# Patient Record
Sex: Female | Born: 1970 | Race: White | Hispanic: No | Marital: Married | Smoking: Former smoker
Health system: Southern US, Community
[De-identification: ages and names within clinical notes are randomized; demographics above are authoritative.]

## PROBLEM LIST (undated history)

## (undated) DIAGNOSIS — B373 Candidiasis of vulva and vagina: Secondary | ICD-10-CM

## (undated) DIAGNOSIS — IMO0002 Reserved for concepts with insufficient information to code with codable children: Secondary | ICD-10-CM

## (undated) DIAGNOSIS — N200 Calculus of kidney: Secondary | ICD-10-CM

## (undated) DIAGNOSIS — Z973 Presence of spectacles and contact lenses: Secondary | ICD-10-CM

## (undated) DIAGNOSIS — Z87440 Personal history of urinary (tract) infections: Secondary | ICD-10-CM

## (undated) DIAGNOSIS — R7309 Other abnormal glucose: Secondary | ICD-10-CM

## (undated) DIAGNOSIS — I639 Cerebral infarction, unspecified: Secondary | ICD-10-CM

## (undated) DIAGNOSIS — F329 Major depressive disorder, single episode, unspecified: Secondary | ICD-10-CM

## (undated) DIAGNOSIS — K759 Inflammatory liver disease, unspecified: Secondary | ICD-10-CM

## (undated) DIAGNOSIS — Q211 Atrial septal defect: Secondary | ICD-10-CM

## (undated) DIAGNOSIS — Z87898 Personal history of other specified conditions: Secondary | ICD-10-CM

## (undated) DIAGNOSIS — Z01419 Encounter for gynecological examination (general) (routine) without abnormal findings: Secondary | ICD-10-CM

## (undated) DIAGNOSIS — Q2112 Patent foramen ovale: Secondary | ICD-10-CM

## (undated) DIAGNOSIS — E039 Hypothyroidism, unspecified: Secondary | ICD-10-CM

## (undated) DIAGNOSIS — M25559 Pain in unspecified hip: Secondary | ICD-10-CM

## (undated) DIAGNOSIS — F32A Depression, unspecified: Secondary | ICD-10-CM

## (undated) DIAGNOSIS — T7840XA Allergy, unspecified, initial encounter: Secondary | ICD-10-CM

## (undated) DIAGNOSIS — O09529 Supervision of elderly multigravida, unspecified trimester: Secondary | ICD-10-CM

## (undated) DIAGNOSIS — D219 Benign neoplasm of connective and other soft tissue, unspecified: Secondary | ICD-10-CM

## (undated) DIAGNOSIS — Z8619 Personal history of other infectious and parasitic diseases: Secondary | ICD-10-CM

## (undated) HISTORY — DX: Encounter for gynecological examination (general) (routine) without abnormal findings: Z01.419

## (undated) HISTORY — DX: Inflammatory liver disease, unspecified: K75.9

## (undated) HISTORY — DX: Reserved for concepts with insufficient information to code with codable children: IMO0002

## (undated) HISTORY — DX: Candidiasis of vulva and vagina: B37.3

## (undated) HISTORY — DX: Allergy, unspecified, initial encounter: T78.40XA

## (undated) HISTORY — DX: Hypothyroidism, unspecified: E03.9

## (undated) HISTORY — DX: Other abnormal glucose: R73.09

## (undated) HISTORY — DX: Personal history of urinary (tract) infections: Z87.440

## (undated) HISTORY — PX: UMBILICAL HERNIA REPAIR: SHX196

## (undated) HISTORY — DX: Presence of spectacles and contact lenses: Z97.3

## (undated) HISTORY — DX: Depression, unspecified: F32.A

## (undated) HISTORY — DX: Supervision of elderly multigravida, unspecified trimester: O09.529

## (undated) HISTORY — DX: Personal history of other specified conditions: Z87.898

## (undated) HISTORY — DX: Personal history of other infectious and parasitic diseases: Z86.19

## (undated) HISTORY — DX: Cerebral infarction, unspecified: I63.9

## (undated) HISTORY — DX: Major depressive disorder, single episode, unspecified: F32.9

## (undated) HISTORY — DX: Benign neoplasm of connective and other soft tissue, unspecified: D21.9

---

## 2004-12-20 DIAGNOSIS — Z87898 Personal history of other specified conditions: Secondary | ICD-10-CM

## 2004-12-20 DIAGNOSIS — B3731 Acute candidiasis of vulva and vagina: Secondary | ICD-10-CM

## 2004-12-20 DIAGNOSIS — B373 Candidiasis of vulva and vagina: Secondary | ICD-10-CM

## 2004-12-20 HISTORY — DX: Acute candidiasis of vulva and vagina: B37.31

## 2004-12-20 HISTORY — DX: Personal history of other specified conditions: Z87.898

## 2004-12-20 HISTORY — DX: Candidiasis of vulva and vagina: B37.3

## 2005-09-30 ENCOUNTER — Other Ambulatory Visit: Admission: RE | Admit: 2005-09-30 | Discharge: 2005-09-30 | Payer: Self-pay | Admitting: Obstetrics and Gynecology

## 2006-04-05 ENCOUNTER — Inpatient Hospital Stay (HOSPITAL_COMMUNITY): Admission: AD | Admit: 2006-04-05 | Discharge: 2006-04-07 | Payer: Self-pay | Admitting: Obstetrics and Gynecology

## 2006-04-08 ENCOUNTER — Ambulatory Visit: Admission: RE | Admit: 2006-04-08 | Discharge: 2006-04-08 | Payer: Self-pay | Admitting: Obstetrics and Gynecology

## 2009-02-27 ENCOUNTER — Inpatient Hospital Stay (HOSPITAL_COMMUNITY): Admission: AD | Admit: 2009-02-27 | Discharge: 2009-03-01 | Payer: Self-pay | Admitting: Obstetrics and Gynecology

## 2011-04-01 LAB — CBC
MCHC: 33.3 g/dL (ref 30.0–36.0)
Platelets: 177 10*3/uL (ref 150–400)
Platelets: 208 10*3/uL (ref 150–400)
RBC: 3.44 MIL/uL — ABNORMAL LOW (ref 3.87–5.11)
RDW: 13.6 % (ref 11.5–15.5)
WBC: 7.8 10*3/uL (ref 4.0–10.5)

## 2011-04-01 LAB — RPR: RPR Ser Ql: NONREACTIVE
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
## 2011-06-02 ENCOUNTER — Encounter: Payer: Self-pay | Admitting: *Deleted

## 2011-06-07 ENCOUNTER — Encounter: Payer: Self-pay | Admitting: Family Medicine

## 2011-06-07 ENCOUNTER — Ambulatory Visit (INDEPENDENT_AMBULATORY_CARE_PROVIDER_SITE_OTHER): Payer: BC Managed Care – PPO | Admitting: Family Medicine

## 2011-06-07 VITALS — BP 108/74 | HR 68 | Ht 63.0 in | Wt 140.0 lb

## 2011-06-07 DIAGNOSIS — Z23 Encounter for immunization: Secondary | ICD-10-CM

## 2011-06-07 DIAGNOSIS — J309 Allergic rhinitis, unspecified: Secondary | ICD-10-CM

## 2011-06-07 DIAGNOSIS — R5383 Other fatigue: Secondary | ICD-10-CM

## 2011-06-07 DIAGNOSIS — R5381 Other malaise: Secondary | ICD-10-CM

## 2011-06-07 DIAGNOSIS — R413 Other amnesia: Secondary | ICD-10-CM

## 2011-06-07 DIAGNOSIS — Z0000 Encounter for general adult medical examination without abnormal findings: Secondary | ICD-10-CM

## 2011-06-07 LAB — POCT URINALYSIS DIPSTICK
Bilirubin, UA: NEGATIVE
Glucose, UA: NEGATIVE
Leukocytes, UA: NEGATIVE
Nitrite, UA: NEGATIVE
pH, UA: 7

## 2011-06-07 NOTE — Progress Notes (Signed)
Subjective:    Patient ID: Julie Blair, female    DOB: Dec 08, 1971, 40 y.o.   MRN: [id number]  HPI Lachrisha Ziebarth is a 40 y.o. female who presents for a complete physical.  She sees Dr. Normand Sloop for her GYN care. She has the following concerns: Feels like she is thinking slower than she used to, having some intermittent memory problems.  Sleeping 6 hours/night.   There is no immunization history on file for this patient. Last tetanus: over 10 years ago Gets flu shots yearly Last Pap smear: last summer (appt next month) Last mammogram: never Last colonoscopy: N/A Last DEXA: N/A Exercise:  2-3 times/week Dentist: once a year Ophtho: yearly  Past Medical History  Diagnosis Date  . Elevated glucose tolerance test gestional 11/2008    3 hour test was normal  . Allergy seasonal    Past Surgical History  Procedure Date  . Umbilical hernia repair age 34 or 8    History   Social History  . Marital Status: Married    Spouse Name: N/A    Number of Children: 2  . Years of Education: N/A   Occupational History  . works in Engineering geologist at [employer]   Social History Main Topics  . Smoking status: Former Smoker    Quit date: 12/20/1994  . Smokeless tobacco: Not on file  . Alcohol Use: Yes     1-2 glasses of wine or beer EOD  . Drug Use: No  . Sexually Active: Yes    Birth Control/ Protection: Condom   Other Topics Concern  . Not on file   Social History Narrative  . No narrative on file    Family History  Problem Relation Age of Onset  . Adopted: Yes  . Breast cancer Maternal Aunt   . Mental illness Mother   . Heart disease Maternal Grandmother   . Heart disease Maternal Grandfather     Current outpatient prescriptions:B Complex-C-E-Zn (B COMPLEX-C-E-ZINC) tablet, Take 1 tablet by mouth daily.  , Disp: , Rfl: ;  Multiple Vitamins-Minerals (MULTIVITAMIN WITH MINERALS) tablet, Take 1 tablet by mouth daily.  , Disp: , Rfl: ;  Omega 3-6-9 Fatty Acids (TRIPLE OMEGA COMPLEX  PO), Take 1 capsule by mouth daily.  , Disp: , Rfl:   No Known Allergies  Review of Systems The patient denies anorexia, fever, weight changes, headaches,  vision changes, decreased hearing, ear pain, sore throat, breast concerns, chest pain, palpitations, dizziness, syncope, dyspnea on exertion, cough, swelling, nausea, vomiting, diarrhea, constipation, abdominal pain, melena, hematochezia, indigestion/heartburn, hematuria, incontinence, dysuria, irregular menstrual cycles, vaginal discharge, odor or itch,  joint pains, weakness, tremor, suspicious skin lesions, abnormal bleeding/bruising, or enlarged lymph nodes.  +occasionally numbness in toes while jogging; genital wart noted last summer.  Occasionally feels down and anxious (related to her caffeine intake)    Objective:   Physical Exam BP 108/74  Pulse 68  Ht 5\' 3"  (1.6 m)  Wt 140 lb (63.504 kg)  BMI 24.80 kg/m2  LMP 05/17/2011  General Appearance:    Alert, cooperative, no distress, appears stated age. Appears calm, and not anxious, but writing everything down  Head:    Normocephalic, without obvious abnormality, atraumatic  Eyes:    PERRL, conjunctiva/corneas clear, EOM's intact, fundi    benign  Ears:    Normal TM's and external ear canals  Nose:   Nares normal, mucosa mildly edematous with clear drainage.  No sinus   tenderness  Throat:   Lips, mucosa, and tongue  normal; teeth and gums normal  Neck:   Supple, no lymphadenopathy;  thyroid:  no   enlargement/tenderness/nodules; no carotid   bruit or JVD  Back:    Spine nontender, no curvature, ROM normal, no CVA     tenderness  Lungs:     Clear to auscultation bilaterally without wheezes, rales or     ronchi; respirations unlabored  Chest Wall:    No tenderness or deformity   Heart:    Regular rate and rhythm, S1 and S2 normal, no murmur, rub   or gallop  Breast Exam:    Deferred to GYN  Abdomen:     Soft, non-tender, nondistended, normoactive bowel sounds,    no masses, no  hepatosplenomegaly  Genitalia:    Deferred to GYN     Extremities:   No clubbing, cyanosis or edema  Pulses:   2+ and symmetric all extremities  Skin:   Skin color, texture, turgor normal, no rashes or lesions  Lymph nodes:   Cervical, supraclavicular, and axillary nodes normal  Neurologic:   CNII-XII intact, normal strength, sensation and gait; reflexes 2+ and symmetric throughout          Psych:   Normal mood, affect, hygiene and grooming.         Assessment & Plan:    1. Routine general medical examination at a health care facility  Visual acuity screening, POCT urinalysis dipstick  2. Need for Tdap vaccination  Tdap vaccine greater than or equal to 7yo IM  3. Memory loss  Vitamin B12  4. Fatigue  CBC with Differential, Lipid panel, Comprehensive metabolic panel, Vitamin D 25 hydroxy, TSH  5. Allergic rhinitis, cause unspecified      Discussed monthly self breast exams and yearly mammograms after the age of 16; at least 30 minutes of aerobic activity at least 5 days/week; proper sunscreen use reviewed; healthy diet, including goals of calcium and vitamin D intake and alcohol recommendations (less than or equal to 1 drink/day) reviewed; regular seatbelt use; changing batteries in smoke detectors.  Immunization recommendations discussed--TdaP given.  Colonoscopy recommendations reviewed (age 24).  Discussed that her fatigue may be related to inadequate sleep and contributed by allergies.  Consider trial of OTC antihistamine such as Claritin or Zyrtec

## 2011-06-11 ENCOUNTER — Other Ambulatory Visit: Payer: BC Managed Care – PPO

## 2011-06-11 DIAGNOSIS — R5383 Other fatigue: Secondary | ICD-10-CM

## 2011-06-11 DIAGNOSIS — R413 Other amnesia: Secondary | ICD-10-CM

## 2011-06-11 LAB — COMPREHENSIVE METABOLIC PANEL
BUN: 10 mg/dL (ref 6–23)
CO2: 24 mEq/L (ref 19–32)
Calcium: 9 mg/dL (ref 8.4–10.5)
Chloride: 107 mEq/L (ref 96–112)
Creat: 0.82 mg/dL (ref 0.50–1.10)

## 2011-06-11 LAB — VITAMIN B12: Vitamin B-12: 494 pg/mL (ref 211–911)

## 2011-06-11 LAB — LIPID PANEL
Cholesterol: 128 mg/dL (ref 0–200)
Triglycerides: 38 mg/dL (ref <150)
VLDL: 8 mg/dL (ref 0–40)

## 2011-06-11 LAB — CBC WITH DIFFERENTIAL/PLATELET
Basophils Absolute: 0 10*3/uL (ref 0.0–0.1)
Basophils Relative: 1 % (ref 0–1)
Hemoglobin: 13.3 g/dL (ref 12.0–15.0)
Lymphocytes Relative: 32 % (ref 12–46)
MCHC: 33.5 g/dL (ref 30.0–36.0)
Monocytes Relative: 8 % (ref 3–12)
Neutro Abs: 2.5 10*3/uL (ref 1.7–7.7)
Neutrophils Relative %: 57 % (ref 43–77)
RBC: 4.47 MIL/uL (ref 3.87–5.11)
WBC: 4.5 10*3/uL (ref 4.0–10.5)

## 2011-06-12 LAB — VITAMIN D 25 HYDROXY (VIT D DEFICIENCY, FRACTURES): Vit D, 25-Hydroxy: 46 ng/mL (ref 30–89)

## 2011-06-14 ENCOUNTER — Encounter: Payer: Self-pay | Admitting: Family Medicine

## 2012-07-24 ENCOUNTER — Ambulatory Visit (INDEPENDENT_AMBULATORY_CARE_PROVIDER_SITE_OTHER): Payer: BC Managed Care – PPO | Admitting: Obstetrics and Gynecology

## 2012-07-24 ENCOUNTER — Encounter: Payer: Self-pay | Admitting: Obstetrics and Gynecology

## 2012-07-24 VITALS — BP 110/60 | Resp 14 | Ht 63.0 in | Wt 145.0 lb

## 2012-07-24 DIAGNOSIS — Z01419 Encounter for gynecological examination (general) (routine) without abnormal findings: Secondary | ICD-10-CM

## 2012-07-24 DIAGNOSIS — Z1231 Encounter for screening mammogram for malignant neoplasm of breast: Secondary | ICD-10-CM

## 2012-07-24 DIAGNOSIS — Z124 Encounter for screening for malignant neoplasm of cervix: Secondary | ICD-10-CM
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
## 2012-07-24 NOTE — Addendum Note (Signed)
Addended by: Quinn Axe on: 07/24/2012 04:54 PM   Modules accepted: Orders

## 2012-07-24 NOTE — Progress Notes (Signed)
Subjective:    Julie Blair is a 41 y.o. female, [id number], who presents for an annual exam. The patient states that she and her husband may want to have another child.  Menstrual cycle:   LMP: Patient's last menstrual period was 07/20/2012.  Flow 4 days with pad change every 2-4 hours; doesn't have cramps, just back aches that don't require analgesia             Review of Systems Pertinent items are noted in HPI. Denies pelvic pain, urinary tract symptoms, vaginitis symptoms, irregular bleeding, menopausal symptoms, change in bowel habits or rectal bleeding   Objective:    BP 110/60  Resp 14  Ht 5\' 3"  (1.6 m)  Wt 145 lb (65.772 kg)  BMI 25.69 kg/m2  LMP 07/20/2012    Wt Readings from Last 1 Encounters:  07/24/12 145 lb (65.772 kg)   Body mass index is 25.69 kg/(m^2). General Appearance: Alert, no acute distress HEENT: Grossly normal Neck / Thyroid: Supple, no thyromegaly or cervical adenopathy Lungs: Clear to auscultation bilaterally Back: No CVA tenderness Breast Exam: No masses or nodes.No dimpling, nipple retraction or discharge. Cardiovascular: Regular rate and rhythm.  Gastrointestinal: Soft, non-tender, no masses or organomegaly Pelvic Exam: EGBUS-wnl, vagina-normal rugae, cervix- without lesions or tenderness, uterus appears normal size shape and consistency, adnexae-no masses or tenderness Rectovaginal: no masses and normal sphincter tone Lymphatic Exam: Non-palpable nodes in neck, clavicular,  axillary, or inguinal regions  Skin: no rashes or abnormalities Extremities: no clubbing cyanosis or edema  Neurologic: grossly normal Psychiatric: Alert and oriented   Assessment:   Routine GYN Exam Considering Conception  Plan:  Screening MG  MVI with 400 mcg of folic acid or more  PAP sent  RTO 1 year or prn  Antia Rahal,ELMIRAPA-C

## 2012-07-25 LAB — PAP IG W/ RFLX HPV ASCU

## 2012-07-26 ENCOUNTER — Ambulatory Visit: Payer: Self-pay | Admitting: Obstetrics and Gynecology

## 2012-09-25 ENCOUNTER — Ambulatory Visit: Payer: BC Managed Care – PPO

## 2012-10-18 ENCOUNTER — Ambulatory Visit: Payer: BC Managed Care – PPO

## 2012-10-19 ENCOUNTER — Other Ambulatory Visit: Payer: Self-pay | Admitting: Obstetrics and Gynecology

## 2012-10-19 ENCOUNTER — Ambulatory Visit
Admission: RE | Admit: 2012-10-19 | Discharge: 2012-10-19 | Disposition: A | Discharge status: Home / Self Care | Payer: BC Managed Care – PPO | Source: Ambulatory Visit | Attending: Obstetrics and Gynecology | Admitting: Obstetrics and Gynecology

## 2012-10-19 DIAGNOSIS — N6320 Unspecified lump in the left breast, unspecified quadrant: Secondary | ICD-10-CM

## 2012-10-19 DIAGNOSIS — Z1231 Encounter for screening mammogram for malignant neoplasm of breast: Secondary | ICD-10-CM

## 2012-10-20 ENCOUNTER — Telehealth: Payer: Self-pay

## 2012-10-20 NOTE — Telephone Encounter (Signed)
Spoke with pt to clarify that she has a F/U appt with the breast center per EP. Pt was to F/U regarding left breat mass noted on mammogram.  Pt stated that she had not gotten a call back from the breast center for F/U appt following our orders. Pt desires to call the breast center and return my call with information regarding F/U appt. Julie Blair

## 2012-10-20 NOTE — Progress Notes (Signed)
TC to pt. Has already scheduled F/U mammo and U/S.  EP made aware.

## 2012-10-27 ENCOUNTER — Other Ambulatory Visit: Payer: BC Managed Care – PPO

## 2012-10-27 ENCOUNTER — Ambulatory Visit
Admission: RE | Admit: 2012-10-27 | Discharge: 2012-10-27 | Disposition: A | Discharge status: Home / Self Care | Payer: BC Managed Care – PPO | Source: Ambulatory Visit | Attending: Obstetrics and Gynecology | Admitting: Obstetrics and Gynecology

## 2012-10-27 ENCOUNTER — Other Ambulatory Visit: Payer: Self-pay | Admitting: Obstetrics and Gynecology

## 2012-10-27 DIAGNOSIS — N6320 Unspecified lump in the left breast, unspecified quadrant: Secondary | ICD-10-CM

## 2013-10-26 ENCOUNTER — Encounter: Payer: Self-pay | Admitting: Medical

## 2013-10-26 ENCOUNTER — Ambulatory Visit (INDEPENDENT_AMBULATORY_CARE_PROVIDER_SITE_OTHER): Payer: BC Managed Care – PPO | Admitting: Medical

## 2013-10-26 ENCOUNTER — Telehealth: Payer: Self-pay | Admitting: Medical

## 2013-10-26 VITALS — BP 100/70 | HR 62 | Temp 98.1°F | Resp 16 | Ht 63.2 in | Wt 146.0 lb

## 2013-10-26 DIAGNOSIS — M549 Dorsalgia, unspecified: Secondary | ICD-10-CM

## 2013-10-26 DIAGNOSIS — R4586 Emotional lability: Secondary | ICD-10-CM

## 2013-10-26 DIAGNOSIS — R5383 Other fatigue: Secondary | ICD-10-CM

## 2013-10-26 DIAGNOSIS — R5381 Other malaise: Secondary | ICD-10-CM

## 2013-10-26 DIAGNOSIS — M25569 Pain in unspecified knee: Secondary | ICD-10-CM

## 2013-10-26 DIAGNOSIS — F39 Unspecified mood [affective] disorder: Secondary | ICD-10-CM

## 2013-10-26 DIAGNOSIS — M25561 Pain in right knee: Secondary | ICD-10-CM

## 2013-10-26 DIAGNOSIS — Z0000 Encounter for general adult medical examination without abnormal findings: Secondary | ICD-10-CM

## 2013-10-26 LAB — COMPREHENSIVE METABOLIC PANEL
CO2: 26 mEq/L (ref 19–32)
Creat: 0.77 mg/dL (ref 0.50–1.10)
Glucose, Bld: 95 mg/dL (ref 70–99)
Total Bilirubin: 0.7 mg/dL (ref 0.3–1.2)
Total Protein: 6.8 g/dL (ref 6.0–8.3)

## 2013-10-26 LAB — CBC WITH DIFFERENTIAL/PLATELET
Basophils Absolute: 0 10*3/uL (ref 0.0–0.1)
HCT: 38.9 % (ref 36.0–46.0)
Hemoglobin: 13.5 g/dL (ref 12.0–15.0)
Lymphocytes Relative: 28 % (ref 12–46)
Monocytes Absolute: 0.5 10*3/uL (ref 0.1–1.0)
Neutro Abs: 3.7 10*3/uL (ref 1.7–7.7)
RDW: 14.2 % (ref 11.5–15.5)
WBC: 5.9 10*3/uL (ref 4.0–10.5)

## 2013-10-26 LAB — TSH: TSH: 1.54 u[IU]/mL (ref 0.350–4.500)

## 2013-10-26 LAB — POCT URINALYSIS DIPSTICK
Ketones, UA: NEGATIVE
Protein, UA: NEGATIVE
Spec Grav, UA: 1.01
pH, UA: 5.5

## 2013-10-26 NOTE — Patient Instructions (Signed)
Begin daily Aleve for a few weeks.    Begin Glucosamine Chondroitin daily OTC.  Use daily regimen of stretching.  Use walking, elliptical, stationary bike, or swimming for exercise for now instead of jogging.   Gradually return to jogging in a few weeks if symptoms improved.   Consider counseling.   Chondromalacia Your exam shows your knee pain is likely due to a cartilage swelling and irritation under the knee cap called chondromalacia. The knee cap moves up and down in its groove when you walk, run, or squat. It can become irritated from sports or work activities if the knee cap is not lined up perfectly or your quadriceps muscle is relatively weak. This can cause pain, usually around the knee cap but sometimes the back of the knee. It is most common in young and active people. Climbing stairs, prolonged sitting and rising from a chair will often make the pain worse. Treatment includes rest from activities which make it worse. The pain can be reduced with ice packs and anti-inflammatory pain medicine. Exercises to strengthen the thigh (quadriceps) muscle may help prevent further episodes of this condition. Shoe inserts to correct imbalances in the legs or feet may be prescribed by your doctor or a specialist. Support for the knee cap with a light brace may also be helpful. Call your caregiver if you are not improving after 2 - 3 weeks of treatment.  SEEK MEDICAL CARE IF:  You have increasing pain or your knee becomes hot, swollen, red, or begins to give out or lock up on you. Document Released: 01/13/2005 Document Revised: 02/28/2012 Document Reviewed: 06/03/2009 [facility] Patient Information 2014 [place], [place].   Patellofemoral Pain Your exam shows your knee pain is probably due to a problem with the knee cap, the patella. This problem is also called patellofemoral pain, runner's knee, or chondromalacia. Most of the time, this problem is due to overuse of the knee joint. Repeated bending and  straightening can irritate the underside of the knee cap. When this happens, activities such as running, walking, climbing, biking or jumping usually produce pain. Pain may also occur after prolonged sitting. Other patellofemoral symptoms can include joint stiffness, swelling, and a snapping or grinding sensation with movement. Rest and rehabilitation are usually successful in treating this problem. Surgery is rarely needed. Treatment includes correcting any mechanical factors that could hurt the normal working of the knee. This could be weak thigh muscles or foot problems. Avoid repetitive activities of the knee until the pain and other symptoms improve. Apply ice packs over the knee for 20 to 30 minutes every 2 to 4 hours to reduce pain and swelling. Only take over-the-counter or prescription medicines for pain, discomfort, or fever as directed by your caregiver. Knee braces or neoprene sleeves may help reduce irritation. Rehabilitation exercises to strengthen the quad muscle are often prescribed when your symptoms are better. Call your caregiver for a follow-up exam to evaluate your response to treatment. Document Released: 01/13/2005 Document Revised: 02/28/2012 Document Reviewed: 12/06/2005 [facility] Patient Information 2014 [place], [place].   RESOURCES in [place], [place]  If you are experiencing a mental health crisis or an emergency, please call 911 or go to the nearest emergency department.  [facility]   [phone] [facility]  [phone] [facility]   [phone]  Suicide Hotline 1-800-Suicide 224-605-7283)  National Suicide Prevention Lifeline (219)410-0911  (586)265-1195)  Domestic Violence, Rape/Crisis - [facility] [place] 742-595-6387  [employer] Violence Hotline 1-800-799-SAFE 475 112 0980)  To  report Child or Elder Abuse, please call: [facility] Police Department  [phone] [facility]  Department  [phone]  [facility] Crisis Line [phone]  Teen Crisis line 201-153-4333 or (559) [phone]     Psychiatry and Counseling services   Dr. Andee Poles, psychiatry [phone] office [email or web address] [address], [place], [place] [address] Dr. Andee Poles Valinda Hoar, NP Grayland Ormond, NP  Anxiety, Depression, ADHD, OCD, Eating Disorders, Bipolar, other   [facility] [phone] office [email or web address] [address] [place], [place] [address]  Dr. Lynden Ang, psychiatry services  Dr. Bennie Dallas  Depression, Anxiety, Substance Abuse, Couples Issues, Adolescent Issues Oneta Rack, NP Depression, Anxiety, ADHD, Women's issues, Bipolar Disorder, Substance   Abuse Saul Fordyce, NP Depression, Anxiety, Aging, ADHD, Bipolar Disorder, Substance Abuse Manuela Neptune, NP  Mood disturbances, ADHD, children, adolescents, adults Geronimo Running, Therapist Sexual Addiction, Bipolar, Depression, Anxiety, Substance Addiction Shaaron Adler, Therapist Grief and Loss, Anxiety, Depression, Bipolar, Medical Challenges, Life    Transitions Michaelle Copas, Therapist Substance Abuse, Relationships, Clergy Families, Anger and Stress Management, Postpartum Depression, Pre-Marital Counseling Rochele Raring, Therapist [place], Anxiety, Depression, ADHD, Adjustment Disorder, PTSD, Grief and Loss, Divorce, Adoption Concerns   [facility] [phone] office [email or web address] [address] [place], [place], [place] [address]  Franchot Erichsen, MA, clinical psychologist  Cognitive-Behavior Therapy; Mood Disorders; Anxiety Disorders; adult and child ADHD; Family Therapy; Stress Management; personal growth, and Marital Therapy.    Carlus Pavlov Ph.D., clinical psychologist Cognitive-Behavior Therapy; Mood Disorders; Anxiety Disorders; Stress      Management   Miguel Aschoff Ph.D., clinical psychologist [phone] office [address] [place], [place] [address] Cognitive Behavior Therapy, Depression, Bipolar, Anxiety, Grief and Loss    [facility] [phone] office [address], [place], [place] [address] Crisis services, Family support, in home therapy, treatment for Anxiety, PTSD, Sexual Assault, Substance Abuse, Financial/Credit Counseling, Variety of other services    [email or web address] [phone] office [address], [place], [place] [address]  Veneda Melter, Ph.D., [facility] Family, Couples, Anxiety, Depression, ADHD, Abuse, Anger Management Sherie Don, M.Ed., LPC Couples, Sexual orientation, Domestic violence, Child Abuse, Major Life Change,  Depression Leandra Kern, [place] Marriage counseling, Women's Issues, Depression, Intimacy, Career Issues Madelaine Etienne, Ph.D.,  LPC PTSD, Addictions, Grief, Anxiety, Sexual Orientation Reather Laurence, [facility] Teen and child depression, anxiety, parenting challenges, Adult depression, self injury, relationship issues. Maple Hudson, [facility] Addiction, PTSD, Eating Disorders, Depression, Sexual Orientation Daun Peacock, [facility] Eating disorder, Anxiety and Depression, Grief, Divorce, Couples and Family Counseling, Parenting   Dr. Archer Asa, psychiatry [phone] office [address], [place], [place] [address]  Geriatric psychiatry services   [facility] [phone] office, 24x7 help line [email or web address] [address] [place], [place] [address] Substance Abuse, Depression, Anxiety, Mood Disorders, other Addictions, DWI Assessment/Treatment, Teen Issues, ADHD, Family Therapy Dr. Ezzard Flax, Psychiatry services   Posey Rea, Therapist Initial assessments, Clinical Director, Substance Abuse counseling, DWI and DMV assessments, individual and  group counseling Arrie Senate, Therapist Depression, Anxiety, Dysfunctional families, Individual and Couples Counseling, Addiction, Sexual Abuse, Childhood Trauma, Spiritually Based Counseling Robin Ringer, Therapist [facility], Children and Adult Individual Counseling, Depression, Anxiety, Mood Disorders Danice Goltz, Therapist Ages 5 and up, individual, couple and family therapy, family concerns, ADHD, Mood disorders, Grief, Substance Abuse Weston Settle, Therapist Female patients only - Mood disorders, Depression, Anxiety, PTSD, Gried,   Abuse, Relationships   Dr. Milagros Evener, psychiatry  [phone] office [address], [place], [place] [address]

## 2013-10-26 NOTE — Telephone Encounter (Signed)
We didn't really discuss risks/benefits of medications, and options.  We just talked about this as an option.  I would request she begin with a counseling session first, either through employee assistance plan or with one listed on the handout I gave.     We will call with lab results.     If she needs to return next week and begin medication, discuss option, that is fine.
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
## 2013-11-02 NOTE — Telephone Encounter (Signed)
Pt will call us when she is ready to make appt

## 2014-10-21 ENCOUNTER — Encounter: Payer: Self-pay | Admitting: Medical

## 2015-10-21 DIAGNOSIS — I639 Cerebral infarction, unspecified: Secondary | ICD-10-CM

## 2015-10-21 HISTORY — DX: Cerebral infarction, unspecified: I63.9

## 2015-11-15 ENCOUNTER — Emergency Department (HOSPITAL_COMMUNITY): Payer: BC Managed Care – PPO

## 2015-11-15 ENCOUNTER — Encounter (HOSPITAL_COMMUNITY): Payer: Self-pay | Admitting: Neurology

## 2015-11-15 ENCOUNTER — Inpatient Hospital Stay (HOSPITAL_COMMUNITY)
Admission: EM | Admit: 2015-11-15 | Discharge: 2015-11-18 | DRG: 062 | Disposition: A | Discharge status: Home / Self Care | Payer: BC Managed Care – PPO | Attending: Neurology | Admitting: Neurology

## 2015-11-15 DIAGNOSIS — R29705 NIHSS score 5: Secondary | ICD-10-CM | POA: Diagnosis present

## 2015-11-15 DIAGNOSIS — R471 Dysarthria and anarthria: Secondary | ICD-10-CM | POA: Diagnosis present

## 2015-11-15 DIAGNOSIS — I959 Hypotension, unspecified: Secondary | ICD-10-CM | POA: Diagnosis present

## 2015-11-15 DIAGNOSIS — I6350 Cerebral infarction due to unspecified occlusion or stenosis of unspecified cerebral artery: Secondary | ICD-10-CM | POA: Diagnosis not present

## 2015-11-15 DIAGNOSIS — Q211 Atrial septal defect: Secondary | ICD-10-CM | POA: Diagnosis not present

## 2015-11-15 DIAGNOSIS — I63432 Cerebral infarction due to embolism of left posterior cerebral artery: Secondary | ICD-10-CM | POA: Diagnosis not present

## 2015-11-15 DIAGNOSIS — E785 Hyperlipidemia, unspecified: Secondary | ICD-10-CM | POA: Diagnosis present

## 2015-11-15 DIAGNOSIS — H532 Diplopia: Secondary | ICD-10-CM | POA: Diagnosis present

## 2015-11-15 DIAGNOSIS — Z87891 Personal history of nicotine dependence: Secondary | ICD-10-CM | POA: Diagnosis not present

## 2015-11-15 DIAGNOSIS — R4781 Slurred speech: Secondary | ICD-10-CM | POA: Diagnosis not present

## 2015-11-15 DIAGNOSIS — Q2112 Patent foramen ovale: Secondary | ICD-10-CM

## 2015-11-15 DIAGNOSIS — I6302 Cerebral infarction due to thrombosis of basilar artery: Secondary | ICD-10-CM | POA: Diagnosis not present

## 2015-11-15 DIAGNOSIS — I639 Cerebral infarction, unspecified: Secondary | ICD-10-CM | POA: Diagnosis present

## 2015-11-15 DIAGNOSIS — I340 Nonrheumatic mitral (valve) insufficiency: Secondary | ICD-10-CM | POA: Diagnosis not present

## 2015-11-15 LAB — DIFFERENTIAL
Basophils Absolute: 0 K/uL (ref 0.0–0.1)
Basophils Relative: 1 %
Eosinophils Absolute: 0.1 K/uL (ref 0.0–0.7)
Eosinophils Relative: 2 %
Lymphocytes Relative: 27 %
Lymphs Abs: 1.5 K/uL (ref 0.7–4.0)
Monocytes Absolute: 0.6 K/uL (ref 0.1–1.0)
Monocytes Relative: 11 %
Neutro Abs: 3.2 K/uL (ref 1.7–7.7)
Neutrophils Relative %: 59 %

## 2015-11-15 LAB — COMPREHENSIVE METABOLIC PANEL WITH GFR
ALT: 17 U/L (ref 14–54)
AST: 22 U/L (ref 15–41)
Albumin: 3.7 g/dL (ref 3.5–5.0)
Alkaline Phosphatase: 43 U/L (ref 38–126)
Anion gap: 7 (ref 5–15)
BUN: 8 mg/dL (ref 6–20)
CO2: 23 mmol/L (ref 22–32)
Calcium: 8.7 mg/dL — ABNORMAL LOW (ref 8.9–10.3)
Chloride: 105 mmol/L (ref 101–111)
Creatinine, Ser: 1.05 mg/dL — ABNORMAL HIGH (ref 0.44–1.00)
GFR calc Af Amer: >60 mL/min
GFR calc non Af Amer: >60 mL/min
Glucose, Bld: 107 mg/dL — ABNORMAL HIGH (ref 65–99)
Potassium: 3.9 mmol/L (ref 3.5–5.1)
Sodium: 135 mmol/L (ref 135–145)
Total Bilirubin: 0.9 mg/dL (ref 0.3–1.2)
Total Protein: 6.4 g/dL — ABNORMAL LOW (ref 6.5–8.1)

## 2015-11-15 LAB — URINALYSIS, ROUTINE W REFLEX MICROSCOPIC
Bilirubin Urine: NEGATIVE
Glucose, UA: NEGATIVE mg/dL
Hgb urine dipstick: NEGATIVE
Ketones, ur: NEGATIVE mg/dL
Leukocytes, UA: NEGATIVE
Nitrite: NEGATIVE
Protein, ur: NEGATIVE mg/dL
Specific Gravity, Urine: 1.01 (ref 1.005–1.030)
pH: 8 (ref 5.0–8.0)

## 2015-11-15 LAB — CBC
HCT: 39.9 % (ref 36.0–46.0)
Hemoglobin: 13.4 g/dL (ref 12.0–15.0)
MCH: 30 pg (ref 26.0–34.0)
MCHC: 33.6 g/dL (ref 30.0–36.0)
MCV: 89.3 fL (ref 78.0–100.0)
Platelets: 217 K/uL (ref 150–400)
RBC: 4.47 MIL/uL (ref 3.87–5.11)
RDW: 12.8 % (ref 11.5–15.5)
WBC: 5.4 K/uL (ref 4.0–10.5)

## 2015-11-15 LAB — RAPID URINE DRUG SCREEN, HOSP PERFORMED
Amphetamines: NOT DETECTED
Barbiturates: NOT DETECTED
Benzodiazepines: NOT DETECTED
Cocaine: NOT DETECTED
Opiates: NOT DETECTED
Tetrahydrocannabinol: NOT DETECTED

## 2015-11-15 LAB — I-STAT CHEM 8, ED
BUN: 9 mg/dL (ref 6–20)
CREATININE: 1 mg/dL (ref 0.44–1.00)
Calcium, Ion: 1.06 mmol/L — ABNORMAL LOW (ref 1.12–1.23)
Chloride: 103 mmol/L (ref 101–111)
Glucose, Bld: 109 mg/dL — ABNORMAL HIGH (ref 65–99)
HEMATOCRIT: 44 % (ref 36.0–46.0)
HEMOGLOBIN: 15 g/dL (ref 12.0–15.0)
POTASSIUM: 4 mmol/L (ref 3.5–5.1)
SODIUM: 139 mmol/L (ref 135–145)
TCO2: 24 mmol/L (ref 0–100)

## 2015-11-15 LAB — I-STAT TROPONIN, ED: Troponin i, poc: 0 ng/mL (ref 0.00–0.08)

## 2015-11-15 LAB — APTT: aPTT: 27 s (ref 24–37)

## 2015-11-15 LAB — ETHANOL

## 2015-11-15 LAB — PROTIME-INR
INR: 0.97 (ref 0.00–1.49)
Prothrombin Time: 13.1 s (ref 11.6–15.2)

## 2015-11-15 LAB — MRSA PCR SCREENING: MRSA BY PCR: NEGATIVE

## 2015-11-15 MED ORDER — ACETAMINOPHEN 325 MG PO TABS
650.0000 mg | ORAL_TABLET | ORAL | Status: DC | PRN
  Administered 2015-11-15: 650 mg via ORAL
  Filled 2015-11-15: qty 2

## 2015-11-15 MED ORDER — ALTEPLASE (STROKE) FULL DOSE INFUSION
0.9000 mg/kg | Freq: Once | INTRAVENOUS | Status: AC
  Administered 2015-11-15: 60 mg via INTRAVENOUS
  Filled 2015-11-15: qty 60

## 2015-11-15 MED ORDER — ONDANSETRON HCL 4 MG/2ML IJ SOLN
4.0000 mg | Freq: Once | INTRAMUSCULAR | Status: AC
  Administered 2015-11-15: 4 mg via INTRAVENOUS
  Filled 2015-11-15: qty 2

## 2015-11-15 MED ORDER — STROKE: EARLY STAGES OF RECOVERY BOOK
Freq: Once | Status: AC
  Administered 2015-11-15: 12:00:00
  Filled 2015-11-15: qty 1

## 2015-11-15 MED ORDER — IOHEXOL 350 MG/ML SOLN
50.0000 mL | Freq: Once | INTRAVENOUS | Status: AC | PRN
  Administered 2015-11-15: 50 mL via INTRAVENOUS

## 2015-11-15 MED ORDER — INFLUENZA VAC SPLIT QUAD 0.5 ML IM SUSY
0.5000 mL | PREFILLED_SYRINGE | INTRAMUSCULAR | Status: AC
  Administered 2015-11-17: 0.5 mL via INTRAMUSCULAR
  Filled 2015-11-15: qty 0.5

## 2015-11-15 MED ORDER — SODIUM CHLORIDE 0.9 % IV SOLN
INTRAVENOUS | Status: DC
  Administered 2015-11-15 (×2): via INTRAVENOUS

## 2015-11-15 MED ORDER — ACETAMINOPHEN 650 MG RE SUPP: 650.0000 mg | RECTAL | Status: DC | PRN

## 2015-11-15 MED ORDER — LABETALOL HCL 5 MG/ML IV SOLN
10.0000 mg | INTRAVENOUS | Status: DC | PRN
  Filled 2015-11-15: qty 4

## 2015-11-15 MED ORDER — PANTOPRAZOLE SODIUM 40 MG IV SOLR
40.0000 mg | Freq: Every day | INTRAVENOUS | Status: DC
  Administered 2015-11-15: 40 mg via INTRAVENOUS
  Filled 2015-11-15: qty 40

## 2015-11-15 NOTE — Progress Notes (Signed)
Code stroke called at [phone].  Patient arrived to [facility] ED via private vehicle at [phone].  Patient LSN 0830, was playing computer games when she became dizzy, started seeing spots of light and slurred speech.  NIHSS 5.  MD notified pharmacy for TPA at [phone] and TPA delivered to bedside at [phone].  Foley to be placed prior to start of TPA.  TPA started at 0955.  Patient to CTA, started vomiting after contrast given.  Patient to go to ICU.
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
## 2015-11-15 NOTE — ED Notes (Signed)
Pt reports around 0830, your eyes started jumping and trouble seeing with slurred speech. Reports this episode got better, then occurred a  Few minutes later. At current pt has slurred speech and feels like her eyes are crossed and having double vision.

## 2015-11-15 NOTE — Progress Notes (Addendum)
Dr. Nicole Kindred verbally asked for tPA at [phone]. tPA delivered at 0951. Per discussion with RN, pt to get foley, then tPA to be given  Julie Blair D. Nuala Chiles, PharmD, BCPS Clinical Pharmacist Pager: [phone] 11/15/2015 9:54 AM

## 2015-11-15 NOTE — ED Notes (Signed)
Returned to room from CT, pt had episode of vomiting after contrast

## 2015-11-15 NOTE — ED Notes (Signed)
See TPA neuro check

## 2015-11-16 ENCOUNTER — Inpatient Hospital Stay (HOSPITAL_COMMUNITY): Payer: BC Managed Care – PPO

## 2015-11-16 ENCOUNTER — Other Ambulatory Visit: Payer: Self-pay | Admitting: Neurology

## 2015-11-16 DIAGNOSIS — I639 Cerebral infarction, unspecified: Secondary | ICD-10-CM

## 2015-11-16 LAB — ANTITHROMBIN III: AntiThromb III Func: 100 % (ref 75–120)

## 2015-11-16 LAB — RAPID HIV SCREEN (HIV 1/2 AB+AG)
HIV 1/2 Antibodies: NONREACTIVE
HIV-1 P24 Antigen - HIV24: NONREACTIVE

## 2015-11-16 LAB — LIPID PANEL
CHOLESTEROL: 142 mg/dL (ref 0–200)
HDL: 45 mg/dL (ref >40)
LDL Cholesterol: 78 mg/dL (ref 0–99)
Total CHOL/HDL Ratio: 3.2 RATIO
Triglycerides: 94 mg/dL (ref <150)
VLDL: 19 mg/dL (ref 0–40)

## 2015-11-16 LAB — SEDIMENTATION RATE: Sed Rate: 7 mm/hr (ref 0–22)

## 2015-11-16 LAB — TSH: TSH: 2.764 u[IU]/mL (ref 0.350–4.500)

## 2015-11-16 MED ORDER — ASPIRIN 325 MG PO TABS
325.0000 mg | ORAL_TABLET | Freq: Every day | ORAL | Status: DC
  Administered 2015-11-16 – 2015-11-18 (×3): 325 mg via ORAL
  Filled 2015-11-16 (×3): qty 1

## 2015-11-16 MED ORDER — PANTOPRAZOLE SODIUM 40 MG PO TBEC
40.0000 mg | DELAYED_RELEASE_TABLET | Freq: Every day | ORAL | Status: DC
  Administered 2015-11-16 – 2015-11-18 (×3): 40 mg via ORAL
  Filled 2015-11-16 (×3): qty 1

## 2015-11-16 MED ORDER — SODIUM CHLORIDE 0.9 % IV BOLUS (SEPSIS)
1000.0000 mL | Freq: Once | INTRAVENOUS | Status: AC
  Administered 2015-11-16: 1000 mL via INTRAVENOUS

## 2015-11-16 MED ORDER — ATORVASTATIN CALCIUM 20 MG PO TABS
20.0000 mg | ORAL_TABLET | Freq: Every day | ORAL | Status: DC
  Administered 2015-11-16 – 2015-11-18 (×3): 20 mg via ORAL
  Filled 2015-11-16 (×3): qty 1

## 2015-11-16 NOTE — Progress Notes (Signed)
SLP Cancellation Note  Patient Details Name: Julie Blair MRN: [id number] DOB: 01/25/1971   Cancelled treatment:       Reason Eval/Treat Not Completed: Pt with PT when SLP attempted SLE; will see on 11/17/15   ADAMS,PAT, M.S., CCC-SLP 11/16/2015, 1:21 PM

## 2015-11-16 NOTE — Progress Notes (Addendum)
Pt's BP was  89/47 mm of hg, rechecked BP is 93/61 mm of hg. Pt is asymptomatic , and is sleeping quietly. On-call provider Mike Craze notified via text page. Order received for 1 L NS bolus. Will  monitor.

## 2015-11-16 NOTE — Progress Notes (Signed)
Utilization review completed.  

## 2015-11-16 NOTE — Progress Notes (Signed)
Pts BP 81/54. MD Leonel Ramsay notified. Orders received to give 1 L bolus and increase rate of maintenance fluids to 100 ml/hr.

## 2015-11-16 NOTE — Evaluation (Signed)
Occupational Therapy Evaluation Patient Details Name: Julie Blair MRN: [id number] DOB: 1971-09-11 Today's Date: 11/16/2015    History of Present Illness Julie Blair is an 44 y.o. 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   Clinical Impression   Pt admitted with above. She demonstrates the below listed deficits and will benefit from continued OT to maximize safety and independence with BADLs.  Pt presents to OT with significant visual impairment and decreased balance.  She requires min guard assist with ADLs.  Binasal occlusion to Lt lens of glasses which reduced the diplopia while allowing input to peripheral vision to improve balance and to allow facilitate binocular use of eyes.  She was instructed when to use glasses (with occlusion), and when to patch, as well as provided with initial HEP for vision.  She is very motivated to improve.  Recommend OPOT at discharge.       Follow Up Recommendations  Outpatient OT;Supervision/Assistance - 24 hour    Equipment Recommendations  Tub/shower seat    Recommendations for Other Services       Precautions / Restrictions Precautions Precautions: Fall Precaution Comments: Pt with diplopia and dizziness       Mobility Bed Mobility Overal bed mobility: Needs Assistance Bed Mobility: Supine to Sit;Sit to Supine     Supine to sit: Supervision Sit to supine: Supervision   General bed mobility comments: supervision for safety   Transfers Overall transfer level: Needs assistance   Transfers: Stand Pivot Transfers;Sit to/from Stand Sit to Stand: Min guard Stand pivot transfers: Min assist       General transfer comment: min guard due to dizziness and mild balance deficits     Balance Overall balance assessment: Needs assistance Sitting-balance  support: Feet supported Sitting balance-Leahy Scale: Good     Standing balance support: During functional activity Standing balance-Leahy Scale: Fair                              ADL Overall ADL's : Needs assistance/impaired Eating/Feeding: Modified independent;Sitting;Bed level   Grooming: Wash/dry hands;Wash/dry face;Oral care;Brushing hair;Min guard;Standing   Upper Body Bathing: Set up;Sitting   Lower Body Bathing: Min guard;Sit to/from stand   Upper Body Dressing : Set up;Sitting   Lower Body Dressing: Min guard;Sit to/from stand   Toilet Transfer: Min guard;RW;Grab Environmental education officer and Hygiene: Min guard;Sit to/from stand       Functional mobility during ADLs: Min guard General ADL Comments: Pt is very motivated and demonstrates good safety awareness      Vision Vision Assessment?: Yes Eye Alignment: Impaired (comment) Ocular Range of Motion: Restricted looking up;Restricted looking down;Restricted on the right;Restricted on the left Tracking/Visual Pursuits: Decreased smoothness of horizontal tracking;Decreased smoothness of vertical tracking;Decreased smoothness of eye movement to RIGHT superior field;Decreased smoothness of eye movement to LEFT superior field;Right eye does not track medially;Left eye does not track medially Convergence: Impaired (comment) Visual Fields: No apparent deficits Diplopia Assessment: Disappears with one eye closed;Present all the time/all directions Depth Perception: Overshoots;Undershoots Additional Comments: Pt with difficulty adducting both eyes and with superior gaze both eyes lt and Rt quadrants.   Nystagmus noted with vertical eye movements and to a lesser extent with attempts at adduction    Perception Perception Perception Tested?: Yes  Praxis Praxis Praxis tested?: Within functional limits    Pertinent Vitals/Pain Pain Assessment: 0-10     Hand Dominance Right    Extremity/Trunk Assessment Upper Extremity Assessment Upper Extremity Assessment: Overall WFL for tasks assessed   Lower Extremity Assessment Lower Extremity Assessment: Defer to PT evaluation   Cervical / Trunk Assessment Cervical / Trunk Assessment: Normal   Communication Communication Communication: No difficulties   Cognition Arousal/Alertness: Awake/alert Behavior During Therapy: WFL for tasks assessed/performed Overall Cognitive Status: Within Functional Limits for tasks assessed                     General Comments       Exercises Exercises: Other exercises Other Exercises Other Exercises: Pt instructed in initial HEP for tracking each eye using patch on opposite eye    Shoulder Instructions      Home Living Family/patient expects to be discharged to:: Private residence Living Arrangements: Spouse/significant other;Children (children ages 53 and 8 ) Available Help at Discharge: Family;Available 24 hours/day (mother flew in from [place] and reports she can be here ) Type of Home: House Home Access: Stairs to enter [employer] of Steps: 3 Entrance Stairs-Rails: Right Home Layout: One level     Bathroom Shower/Tub: Teacher, early years/pre: Standard     Home Equipment: None          Prior Functioning/Environment Level of Independence: Independent        Comments: Pt works as Licensed conveyancer at [employer]    OT Diagnosis: Generalized weakness;Disturbance of vision   OT Problem List: Decreased activity tolerance;Impaired balance (sitting and/or standing);Impaired vision/perception;Decreased knowledge of use of DME or AE   OT Treatment/Interventions: Self-care/ADL training;Therapeutic activities;Visual/perceptual remediation/compensation;Patient/family education;Balance training    OT Goals(Current goals can be found in the care plan section) Acute Rehab OT Goals Patient Stated Goal: to return to work  OT Goal Formulation: With  patient Time For Goal Achievement: 11/23/15 Potential to Achieve Goals: Good ADL Goals Pt Will Perform Grooming: with modified independence;sitting;standing Pt Will Perform Upper Body Bathing: with modified independence;sitting;standing Pt Will Perform Lower Body Bathing: with modified independence;sit to/from stand Pt Will Perform Upper Body Dressing: with modified independence;sitting;standing Pt Will Perform Lower Body Dressing: with modified independence;sit to/from stand Pt Will Transfer to Toilet: with modified independence;regular height toilet;ambulating Additional ADL Goal #1: Pt will be independent with HEP for vision   OT Frequency: Min 3X/week   Barriers to D/C:            Co-evaluation              End of Session Nurse Communication: Mobility status  Activity Tolerance: Patient tolerated treatment well Patient left: in bed;with call bell/phone within reach;with nursing/sitter in room   Time: [id number] OT Time Calculation (min): 41 min Charges:  OT General Charges $OT Visit: 1 Procedure OT Evaluation $Initial OT Evaluation Tier I: 1 Procedure OT Treatments $Self Care/Home Management : 23-37 mins G-Codes:    Gregor Dershem M December 12, 2015, 5:24 PM

## 2015-11-16 NOTE — Progress Notes (Signed)
STROKE TEAM PROGRESS NOTE   HISTORY Julie Blair is an 44 y.o. female with no known risk factors for stroke presenting with acute onset of diplopia and slurred speech as well as dizziness at 8:30 AM today. There's no previous history of stroke nor TIA. She has no clear history of migraine headaches, as well. She has had transient episodes of scotomas but no associated severe headaches. She experienced scotomas as well as diplopia today in addition to disequilibrium and slurred speech. No focal weakness of extremities no numbness was experienced. CT scan of her head showed no acute intracranial abnormality. NIH stroke score was 5. Patient was deemed a candidate for thrombolytic therapy with IV TPA, which was administered. CT angiogram of the head and neck showed showed no large vessel occlusion or thrombosis, including patent vertebral and basilar arteries.  LSN: 8:30 AM on 11/15/2015 tPA Given: Yes mRankin:   SUBJECTIVE (INTERVAL HISTORY) Her husband and mother are at the bedside.  Overall she feels her condition is improved. She is still experiencing diplopia but it is improved, her confusion, dizziness and speaking difficulties have resolved. She denies any risk factors for stroke, no history of alcohol or polysubstance abuse or smoking. No risk factors for hypercoagability disorders however she is adopted and family history if unknown.   OBJECTIVE Temp:  [97.4 F (36.3 C)-99 F (37.2 C)] 98.4 F (36.9 C) (11/26 2354) Pulse Rate:  [47-89] 55 (11/27 0700) Cardiac Rhythm:  [-] Normal sinus rhythm (11/26 2000) Resp:  [6-23] 15 (11/27 0700) BP: (80-133)/(51-109) 94/65 mmHg (11/27 0700) SpO2:  [94 %-100 %] 97 % (11/27 0700) Weight:  [67.132 kg (148 lb)-68.1 kg (150 lb 2.1 oz)] 68.1 kg (150 lb 2.1 oz) (11/26 1130)  CBC:  Recent Labs Lab 11/15/15 0916 11/15/15 0925  WBC 5.4  --   NEUTROABS 3.2  --   HGB 13.4 15.0  HCT 39.9 44.0  MCV 89.3  --   PLT 217  --     Basic Metabolic Panel:   Recent Labs Lab 11/15/15 0916 11/15/15 0925  NA 135 139  K 3.9 4.0  CL 105 103  CO2 23  --   GLUCOSE 107* 109*  BUN 8 9  CREATININE 1.05* 1.00  CALCIUM 8.7*  --     Lipid Panel:    Component Value Date/Time   CHOL 142 11/16/2015 0240   TRIG 94 11/16/2015 0240   HDL 45 11/16/2015 0240   CHOLHDL 3.2 11/16/2015 0240   VLDL 19 11/16/2015 0240   LDLCALC 78 11/16/2015 0240   HgbA1c: No results found for: HGBA1C Urine Drug Screen:    Component Value Date/Time   LABOPIA NONE DETECTED 11/15/2015 0959   COCAINSCRNUR NONE DETECTED 11/15/2015 0959   LABBENZ NONE DETECTED 11/15/2015 0959   AMPHETMU NONE DETECTED 11/15/2015 0959   THCU NONE DETECTED 11/15/2015 0959   LABBARB NONE DETECTED 11/15/2015 0959      IMAGING  Ct Angio Head W/cm &/or Wo Cm 11/15/2015   1. No medium or large vessel intracranial arterial occlusion.  2. Decreased number of small distal right MCA branch vessels.  3. Widely patent cervical carotid and vertebral arteries.    Ct Head Wo Contrast 11/15/2015   No acute intracranial abnormality. Small mucous retention cyst or polyp right maxillary sinus.     Ct Angio Neck W/cm &/or Wo/cm 11/15/2015   1. No medium or large vessel intracranial arterial occlusion.  2. Decreased number of small distal right MCA branch vessels.  3. Widely patent  cervical carotid and vertebral arteries.     Mr Brain Wo Contrast 11/16/2015   1. Acute ischemic infarcts involving the medial left thalamus and left paramedian midbrain. Finding consistent with perforator territory infarct arising from the left PCA. No hemorrhage or mass effect.  2. Otherwise normal brain MRI.     PHYSICAL EXAM  Physical exam: Exam: Gen: NAD, conversant, well nourised, obese, well groomed        CV: RRR, no MRG. No Carotid Bruits. No peripheral edema, warm, nontender Eyes: Conjunctivae clear without exudates or hemorrhage  Neuro: Detailed Neurologic Exam  Speech:    Speech is normal;  fluent and spontaneous with normal comprehension.  Cognition:    The patient is oriented to person, place, and time;     recent and remote memory intact;     language fluent;     normal attention, concentration,     fund of knowledge Cranial Nerves:    The pupils are equal, round, and reactive to light.  Visual fields are full to finger confrontation. She has disconjugate gaze  With esotropia of the right eye, impaired left> right upgaze and downgaze, incomplete right abduction with nystagmus on right lateral gaze with fast component to the right. Trigeminal sensation is intact and the muscles of mastication are normal. The face is symmetric. The palate elevates in the midline. Hearing intact. Voice is normal. Shoulder shrug is normal. The tongue has normal motion without fasciculations.   Motor Observation:    No asymmetry, no atrophy, and no involuntary movements noted. Tone:    Normal muscle tone.    Strength:    Strength is V/V in the upper and lower limbs.      Sensation: dec to LT right      Reflex Exam:  DTR's:    Deep tendon reflexes in the upper and lower extremities are normal bilaterally.   Toes:    The toes are downgoing bilaterally.   Clonus:    Clonus is absent.       ASSESSMENT/PLAN [name] is a 44 y.o. female with history of hepatitis and depression, presenting with diplopia and dysarthria.  She did  receive IV t-PA 60 mg on Saturday 11/15/2015 at 09:45.  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  MRA - not performed CTA head and neck -  No medium or large vessel intracranial arterial occlusion.Decreased number of small distal right MCA branch  vessels.   Carotid Doppler - refer to CTA of the neck  2D Echo - pending. Consider TEE and loop if workup negative.  LDL - 78  HgbA1c  pending  Hypercoag work up pending  VTE prophylaxis - SCDs  Diet Heart Room service appropriate?: Yes; Fluid consistency:: Thin  No antithrombotic prior to admission, now on No antithrombotic secondary to TPA administration. Start 81mg  ASA.   Patient counseled to be compliant with her antithrombotic medications  Ongoing aggressive stroke risk factor management  Therapy recommendations:  Pending  Disposition: Pending  Hypertension  Blood pressure tends to run low   Hyperlipidemia  Home meds: No lipid lowering medications prior to admission  LDL 78, goal < 70  Add Lipitor 20 mg daily  Continue statin at discharge   Other Stroke Risk Factors  Cigarette smoker, quit smoking 20 years ago. Was a social smoker, no significant tobacco use maybe a few cigarettes  socially on occassion  ETOH use - 1 ounce per week   Other Active Problems  Mild hypotension   PLAN  Transfer to floor    Hospital day # 1  Personally examined patient and images, personally conducted neuro exam,documented assessment and plan as stated above. I have personally obtained the history, evaluated lab data, reviewed imaging studies and agree with radiology interpretations.    Sarina Ill, MD Stroke Neurology [phone] [facility]    To contact Stroke Continuity provider, please refer to [email or web address] After hours, contact General Neurology

## 2015-11-16 NOTE — Evaluation (Signed)
Physical Therapy Evaluation Patient Details Name: Julie Blair MRN: [id number] DOB: 11-22-1971 Today's Date: 11/16/2015   History of Present Illness  Julie Blair is an 44 y.o. 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  Clinical Impression  Pt admitted with above. Pt with noted vision deficits in addition to double vision greatly affecting ambulation and mobility due to onset of dizziness from the double vision. Began working with a patch which improved pt's ability to focus with unilateral eye. Recommend OT consult to further address vision deficits. Pt with noted disconjugate eyes and inability to focus at this same time and also with peripheral vision deficits. Acute PT to con't to follow to progress mobility as able.     Follow Up Recommendations Outpatient PT;Supervision/Assistance - 24 hour    Equipment Recommendations  None recommended by PT    Recommendations for Other Services       Precautions / Restrictions Precautions Precautions: Other (comment) Precaution Comments: pt with severe diplopia, disconjucate eyes Required Braces or Orthoses:  (ordered eye patch) Restrictions Weight Bearing Restrictions: No      Mobility  Bed Mobility               General bed mobility comments: pt up in chair  Transfers Overall transfer level: Needs assistance Equipment used: 1 person hand held assist Transfers: Sit to/from Stand Sit to Stand: Min guard         General transfer comment: assist due to double vision   Ambulation/Gait Ambulation/Gait assistance: Min assist Ambulation Distance (Feet): 120 Feet Assistive device: 1 person hand held assist Gait Pattern/deviations: Step-through pattern;Decreased stride length Gait velocity: guarded/slow Gait velocity interpretation: Below normal  speed for age/gender General Gait Details: pt very guarded due to dizziness from the double vision. L eye covered with paper over glasses but still had input from peripheral. causing some diplopia which caused the dizziness. Held patients hand and had her close her eyes and patient with increased step length and fluidity of pattern.  Stairs            Wheelchair Mobility    Modified Rankin (Stroke Patients Only) Modified Rankin (Stroke Patients Only) Pre-Morbid Rankin Score: No symptoms Modified Rankin: Moderately severe disability     Balance Overall balance assessment: Needs assistance         Standing balance support: Single extremity supported Standing balance-Leahy Scale: Fair Standing balance comment: static balance is fair however due to diplopia pt unsteady with ambulation                             Pertinent Vitals/Pain Pain Assessment: No/denies pain    Home Living Family/patient expects to be discharged to:: Private residence Living Arrangements: Spouse/significant other Available Help at Discharge: Family;Available 24 hours/day (mother flew in from [place] and reports she can be here ) Type of Home: House Home Access: Stairs to enter Entrance Stairs-Rails: Right Entrance Stairs-Number of Steps: 3 Home Layout: One level Home Equipment: None      Prior Function Level of Independence: Independent         Comments: 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    Extremity/Trunk Assessment   Upper Extremity Assessment: Overall WFL for tasks assessed  Lower Extremity Assessment: Overall WFL for tasks assessed      Cervical / Trunk Assessment: Normal  Communication   Communication: Expressive difficulties (very mild)  Cognition Arousal/Alertness: Awake/alert Behavior During Therapy: WFL for tasks assessed/performed Overall Cognitive Status: Within Functional Limits for tasks assessed                       General Comments      Exercises        Assessment/Plan    PT Assessment Patient needs continued PT services  PT Diagnosis Difficulty walking (diplopia)   PT Problem List Decreased activity tolerance;Decreased balance;Decreased mobility  PT Treatment Interventions DME instruction;Gait training;Stair training;Therapeutic exercise;Therapeutic activities;Functional mobility training;Balance training   PT Goals (Current goals can be found in the Care Plan section) Acute Rehab PT Goals Patient Stated Goal: stop the double vision PT Goal Formulation: With patient Time For Goal Achievement: 11/23/15 Potential to Achieve Goals: Good    Frequency Min 4X/week   Barriers to discharge Decreased caregiver support mother in from [place] and is here until Friday but reports she can stay longer    Co-evaluation               End of Session Equipment Utilized During Treatment: Gait belt Activity Tolerance: Patient tolerated treatment well Patient left: in chair;with call bell/phone within reach;with family/visitor present (lab tech present) Nurse Communication: Mobility status (order eye patch)         Time: 1108-1140 PT Time Calculation (min) (ACUTE ONLY): 32 min   Charges:   PT Evaluation $Initial PT Evaluation Tier I: 1 Procedure PT Treatments $Gait Training: 8-22 mins   PT G CodesKingsley Callander 11/16/2015, 12:07 PM  Kittie Plater, PT, DPT Pager #: [phone] Office #: [phone]

## 2015-11-17 ENCOUNTER — Inpatient Hospital Stay (HOSPITAL_COMMUNITY): Payer: BC Managed Care – PPO

## 2015-11-17 DIAGNOSIS — I6350 Cerebral infarction due to unspecified occlusion or stenosis of unspecified cerebral artery: Secondary | ICD-10-CM

## 2015-11-17 DIAGNOSIS — I6302 Cerebral infarction due to thrombosis of basilar artery: Secondary | ICD-10-CM

## 2015-11-17 LAB — HEMOGLOBINOPATHY EVALUATION
HGB A2 QUANT: 2.5 % (ref 0.7–3.1)
HGB C: 0 %
HGB F QUANT: 0 % (ref 0.0–2.0)
HGB S QUANTITAION: 0 %
Hgb A: 97.5 % (ref 94.0–98.0)

## 2015-11-17 LAB — C3 COMPLEMENT: C3 COMPLEMENT: 116 mg/dL (ref 82–167)

## 2015-11-17 LAB — ANTINUCLEAR ANTIBODIES, IFA: ANA Ab, IFA: NEGATIVE

## 2015-11-17 LAB — ANCA TITERS
Atypical P-ANCA titer: <1:20 {titer}
C-ANCA: <1:20 {titer}
P-ANCA: <1:20 {titer}

## 2015-11-17 LAB — HOMOCYSTEINE: HOMOCYSTEINE-NORM: 5.7 umol/L (ref 0.0–15.0)

## 2015-11-17 LAB — RHEUMATOID FACTOR: Rhuematoid fact SerPl-aCnc: <10 IU/mL (ref 0.0–13.9)

## 2015-11-17 LAB — HEMOGLOBIN A1C
HEMOGLOBIN A1C: 5.7 % — AB (ref 4.8–5.6)
MEAN PLASMA GLUCOSE: 117 mg/dL

## 2015-11-17 LAB — MPO/PR-3 (ANCA) ANTIBODIES

## 2015-11-17 LAB — RPR: RPR Ser Ql: NONREACTIVE

## 2015-11-17 LAB — C4 COMPLEMENT: Complement C4, Body Fluid: 15 mg/dL (ref 14–44)

## 2015-11-17 MED ORDER — SODIUM CHLORIDE 0.9 % IV SOLN
Freq: Once | INTRAVENOUS | Status: AC
  Administered 2015-11-17: via INTRAVENOUS

## 2015-11-17 MED ORDER — BARIUM SULFATE 2.1 % PO SUSP
ORAL | Status: AC
  Administered 2015-11-18: 1 mL
  Filled 2015-11-17: qty 2

## 2015-11-17 NOTE — Progress Notes (Signed)
Pharmacist Provided - Patient Medication Education   Julie Blair is an 44 y.o. female who presented to [facility] on 11/15/2015 with a chief complaint of  Chief Complaint  Patient presents with  . Eye Problem     Allergy Assessment Completed and Updated: [x]  Yes    []  No Identified Patient Allergies: No Known Allergies    Barriers to Obtaining Medications: []  Yes [x]  No   Assessment: Julie Blair understands she may go home with new medications due to her stroke. I discussed side effects of atorvastatin and aspirin she may look out for.   PTA she was not on any prescribed medications. She expressed understanding of knowing why these new medications are important to take at discharge.  Time spent preparing for discharge counseling: 10 minutes Time spent counseling patient: 15 minutes   Julie Blair, PharmD Clinical Pharmacy Resident Pager # [phone] 11/17/2015 2:34 PM

## 2015-11-17 NOTE — Progress Notes (Signed)
  Echocardiogram 2D Echocardiogram has been performed.  Julie Blair 11/17/2015, 9:03 AM

## 2015-11-17 NOTE — Progress Notes (Signed)
STROKE TEAM PROGRESS NOTE   SUBJECTIVE (INTERVAL HISTORY) Her mother and husband are at the bedside. History reviewed in details with patient and husband She has no hx or VTE, recurrent miscarriages, trauma, chiropractic work, family death from stroke or MI at a young age, rash or lupus..    OBJECTIVE Temp:  [97.5 F (36.4 C)-98.5 F (36.9 C)] 98.4 F (36.9 C) (11/28 0459) Pulse Rate:  [55-77] 55 (11/28 0459) Cardiac Rhythm:  [-] Sinus bradycardia (11/28 0700) Resp:  [16-18] 16 (11/28 0459) BP: (89-113)/(47-92) 93/65 mmHg (11/28 0459) SpO2:  [95 %-99 %] 98 % (11/28 0459)  CBC:   Recent Labs Lab 11/15/15 0916 11/15/15 0925  WBC 5.4  --   NEUTROABS 3.2  --   HGB 13.4 15.0  HCT 39.9 44.0  MCV 89.3  --   PLT 217  --     Basic Metabolic Panel:   Recent Labs Lab 11/15/15 0916 11/15/15 0925  NA 135 139  K 3.9 4.0  CL 105 103  CO2 23  --   GLUCOSE 107* 109*  BUN 8 9  CREATININE 1.05* 1.00  CALCIUM 8.7*  --     Lipid Panel:     Component Value Date/Time   CHOL 142 11/16/2015 0240   TRIG 94 11/16/2015 0240   HDL 45 11/16/2015 0240   CHOLHDL 3.2 11/16/2015 0240   VLDL 19 11/16/2015 0240   LDLCALC 78 11/16/2015 0240   HgbA1c:  Lab Results  Component Value Date   HGBA1C 5.7* 11/16/2015   Urine Drug Screen:     Component Value Date/Time   LABOPIA NONE DETECTED 11/15/2015 0959   COCAINSCRNUR NONE DETECTED 11/15/2015 0959   LABBENZ NONE DETECTED 11/15/2015 0959   AMPHETMU NONE DETECTED 11/15/2015 0959   THCU NONE DETECTED 11/15/2015 0959   LABBARB NONE DETECTED 11/15/2015 0959     IMAGING  Ct Angio Head W/cm &/or Wo Cm 11/15/2015   1. No medium or large vessel intracranial arterial occlusion.  2. Decreased number of small distal right MCA branch vessels.  3. Widely patent cervical carotid and vertebral arteries.   Ct Head Wo Contrast 11/15/2015   No acute intracranial abnormality. Small mucous retention cyst or polyp right maxillary sinus.   Ct  Angio Neck W/cm &/or Wo/cm 11/15/2015   1. No medium or large vessel intracranial arterial occlusion.  2. Decreased number of small distal right MCA branch vessels.  3. Widely patent cervical carotid and vertebral arteries.   Mr Brain Wo Contrast 11/16/2015   1. Acute ischemic infarcts involving the medial left thalamus and left paramedian midbrain. Finding consistent with perforator territory infarct arising from the left PCA. No hemorrhage or mass effect.  2. Otherwise normal brain MRI.    PHYSICAL EXAM Gen: NAD, conversant, well nourised, obese, well groomed   CV: RRR, no MRG. No Carotid Bruits. No peripheral edema, warm, nontender Eyes: Conjunctivae clear without exudates or hemorrhage Neuro: Detailed Neurologic Exam Speech:    Speech is normal; fluent and spontaneous with normal comprehension.  Cognition:    The patient is oriented to person, place, and time;     recent and remote memory intact;     language fluent;     normal attention, concentration,     fund of knowledge Cranial Nerves:    The pupils are equal, round, and reactive to light.  Visual fields are full to finger confrontation. She has disconjugate gaze  With esotropia of the right eye, impaired left> right upgaze and downgaze,  incomplete right abduction with nystagmus on right lateral gaze with fast component to the right. facial sensation is intact  . The face is symmetric. The palate elevates in the midline. Hearing intact. Voice is normal. Shoulder shrug is normal. The tongue has normal motion without fasciculations.  Motor Observation:    No asymmetry, no atrophy, and no involuntary movements noted. Tone:    Normal muscle tone.   Strength:    Strength is V/V in the upper and lower limbs.  Sensation: dec to LT right  Reflex Exam: DTR's:    Deep tendon reflexes in the upper and lower extremities are normal bilaterally.   Toes:    The toes are downgoing bilaterally.   Clonus:    Clonus is absent. Gait  deferred   ASSESSMENT/PLAN [name] is a 44 y.o. female with history of hepatitis and depression, presenting with diplopia and dysarthria.  She did  receive IV t-PA 60 mg on Saturday 11/15/2015 at 09:45.  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  MRA - not performed  CTA head and neck -  No medium or large vessel intracranial arterial occlusion.Decreased number of small distal right MCA branch vessels.   2D Echo - No source of embolus   TEE to look for embolic source. Arranged with [facility] for tomorrow.  If positive for PFO (patent foramen ovale), check bilateral lower extremity venous dopplers to rule out DVT as possible source of stroke. (I have made patient NPO after midnight tonight).   LDL - 78  HgbA1c 5.7  Hypercoag work up labs are either negative or pending  Check CT abd/pelvis/chest to rule out malignancy source  VTE prophylaxis - SCDs Diet Heart Room service appropriate?: Yes; Fluid consistency:: Thin  No antithrombotic prior to admission, now on aspirin 325 mg daily.  Patient counseled to be compliant with her antithrombotic medications  Ongoing aggressive stroke risk factor management  Therapy recommendations:  OP PT and OT  Disposition: home with OP therapies   Hypertension  Blood pressure tends to run low  Hyperlipidemia  Home meds: No lipid lowering medications prior to admission  LDL 78, goal < 70  Add Lipitor 20 mg daily  Continue statin at discharge  Other Stroke Risk Factors  Cigarette smoker, quit smoking 20 years ago. Was a social smoker, no significant tobacco use maybe a few cigarettes socially on occassion  ETOH use - 1 ounce per week  Other Active Problems  Mild hypotension  Hospital day #  [facility] for Pager information 11/17/2015 1:30 PM  I have personally examined this patient, reviewed notes, independently viewed imaging studies, participated in medical decision making and plan of care. I have made any additions or clarifications directly to the above note. Agree with note above. The patient has left midbrain and thalamic infarct etiology likely  cryptogenic given absence of significant vascular risk factors and she remains at risk for neurological worsening, recurrent stroke and TIA needs ongoing evaluation. Recommend TEE. Long discussion with the patient and husband at the bedside and answered questions.  Antony Contras, MD Medical Director [facility] Pager: [phone] 11/17/2015 4:34 PM  To contact Stroke Continuity provider, please refer to [email or web address] After hours, contact General Neurology

## 2015-11-17 NOTE — Progress Notes (Signed)
Physical Therapy Treatment Patient Details Name: Julie Blair MRN: [id number] DOB: 02-11-1971 Today's Date: 11/17/2015    History of Present Illness Tatayana Beshears is an 44 y.o. 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    PT Comments    Pt has improved remarkably since yesterday, continues with diplopia, though not feeling dizzy. Pt mod I with ambulation and on stairs. Discussed activity level at d/c as well as need for "brain rest", pt and husband voiced understanding of concept. Pt has met acute PT goals, further needs to be addressed in outpt setting.    Follow Up Recommendations  Outpatient PT;Supervision - Intermittent     Equipment Recommendations  None recommended by PT    Recommendations for Other Services       Precautions / Restrictions Precautions Precaution Comments: diplopia persists Required Braces or Orthoses: Other Brace/Splint Other Brace/Splint: eye patch Restrictions Weight Bearing Restrictions: No    Mobility  Bed Mobility               General bed mobility comments: pt received in standing  Transfers Overall transfer level: Modified independent Equipment used: None Transfers: Sit to/from Stand Sit to Stand: Modified independent (Device/Increase time)         General transfer comment: pt stands and sits safely without assist. Dizziness improved today  Ambulation/Gait Ambulation/Gait assistance: Modified independent (Device/Increase time) Ambulation Distance (Feet): 300 Feet Assistive device: None Gait Pattern/deviations: WFL(Within Functional Limits) Gait velocity: WFL Gait velocity interpretation: at or above normal speed for age/gender General Gait Details: pt ambulating with normal pace today, able to turn head and read room numbers during ambualtion.  Occasionally close to objects on right without realizing it, discussed safety concerns with this.  Pt wore glasses with tape during ambulation   Stairs Stairs: Yes Stairs assistance: Modified independent (Device/Increase time) Stair Management: One rail Right;Alternating pattern;Forwards Number of Stairs: 5 General stair comments: no problem with stairs  Wheelchair Mobility    Modified Rankin (Stroke Patients Only) Modified Rankin (Stroke Patients Only) Pre-Morbid Rankin Score: No symptoms Modified Rankin: Moderate disability     Balance Overall balance assessment: Modified Independent Sitting-balance support: No upper extremity supported Sitting balance-Leahy Scale: Good     Standing balance support: No upper extremity supported Standing balance-Leahy Scale: Good Standing balance comment: pt able to stand on 1 foot for 30 sec each side, able to pick object off floor without difficulty, had some difficulty grasping objects above eye level on her left side with left hand                    Cognition Arousal/Alertness: Awake/alert Behavior During Therapy: WFL for tasks assessed/performed Overall Cognitive Status: Within Functional Limits for tasks assessed                      Exercises      General Comments General comments (skin integrity, edema, etc.): pt does feel that spot glasses are helping though she does still c/o diplopia, patch helps more      Pertinent Vitals/Pain Pain Assessment: No/denies pain Pain Score: 3  Pain Location: eyes Pain Descriptors / Indicators: Aching Pain Intervention(s): Limited activity within patient's tolerance;Monitored during session;Other (comment) (pt declined pain medication )    Home Living     Available Help at Discharge: Family;Available  24 hours/day Type of Home: House              Prior Function            PT Goals (current goals can now be found in the care plan section) Acute Rehab PT  Goals Patient Stated Goal: to return to work  PT Goal Formulation: All assessment and education complete, DC therapy Progress towards PT goals: Goals met/education completed, patient discharged from PT    Frequency  Min 4X/week    PT Plan Current plan remains appropriate    Co-evaluation             End of Session   Activity Tolerance: Patient tolerated treatment well Patient left: in bed;with call bell/phone within reach;with family/visitor present     Time: 9791-5041 PT Time Calculation (min) (ACUTE ONLY): 18 min  Charges:  $Gait Training: 8-22 mins                    G Codes:     Leighton Roach, PT  Acute Rehab Services  [phone]  Leighton Roach 11/17/2015, 12:38 PM

## 2015-11-17 NOTE — Progress Notes (Signed)
    [facility] has been requested to perform a transesophageal echocardiogram on Julie Blair to r/o embolic source of stroke.  After careful review of history and examination, the risks and benefits of transesophageal echocardiogram have been explained including risks of esophageal damage, perforation (1:10,000 risk), bleeding, pharyngeal hematoma as well as other potential complications associated with conscious sedation including aspiration, arrhythmia, respiratory failure and death. Alternatives to treatment were discussed, questions were answered. Patient is willing to proceed.  TEE - Dr. Oval Linsey @ 1100 . NPO after midnight. Meds with sips.   Greogory Cornette, PA-C 11/17/2015 3:12 PM

## 2015-11-17 NOTE — Progress Notes (Signed)
Occupational Therapy Treatment Patient Details Name: Julie Blair MRN: [id number] DOB: Dec 31, 1970 Today's Date: 11/17/2015    History of present illness Julie Blair is an 44 y.o. 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   OT comments  Pt with some improvement with occulomotor control today.  Still with significantly dysconjugate gaze.  She is able to fuse vision in far Rt central field ~24-36" from nose.  She is independent with HEP provided yesterday, added fusion exercises today.  Will continue to follow.  She is mod I with ADLs.   Follow Up Recommendations  Outpatient OT;Supervision/Assistance - 24 hour    Equipment Recommendations  None recommended by OT    Recommendations for Other Services      Precautions / Restrictions Precautions Precaution Comments: diplopia persists       Mobility Bed Mobility Overal bed mobility: Independent                Transfers Overall transfer level: Modified independent                    Balance Overall balance assessment: Modified Independent                                 ADL Overall ADL's : Modified independent                                              Vision                 Additional Comments: Pt reports occlusion working well.  She continues with signficantly dysconjugate gaze.  Lt eye appears to track superiorly a bit better than yesterday, and both eyes with improved medially tracking.   She reports she was able to read for 1.5 hour yesterday with Lt eye occluded.  She reports performing exercises provided yesterday.  She is able to fuse image briefly in far Rt gaze at ~24-36" from nose for ~10 seconds.  She also reports she is close to being able to fuse gaze in front of nose ~16" away, but  mild shadowing present.  Instructed pt to continue HEP provided yesterday, and instructed her to add fusion exercises - she demonstrated understanding.  Discussed appropriate activity level at discharge, and to self monitor closely for signs of fatigue.  Emphasized need to rest breaks.   Instructed her to play video games to work on pursuits (alternate patched eye).  All questions answered.    Perception     Praxis      Cognition   Behavior During Therapy: WFL for tasks assessed/performed Overall Cognitive Status: Within Functional Limits for tasks assessed                       Extremity/Trunk Assessment               Exercises     Shoulder Instructions       General Comments      Pertinent Vitals/ Pain       Pain Assessment: No/denies pain  Home Living  Prior Functioning/Environment              Frequency Min 3X/week     Progress Toward Goals  OT Goals(current goals can now be found in the care plan section)  Progress towards OT goals: Progressing toward goals     Plan Discharge plan remains appropriate;Equipment recommendations need to be updated    Co-evaluation                 End of Session     Activity Tolerance Patient tolerated treatment well   Patient Left in bed;with call bell/phone within reach;with family/visitor present   Nurse Communication Mobility status        Time: [id number] OT Time Calculation (min): 39 min  Charges: OT General Charges $OT Visit: 1 Procedure OT Treatments $Therapeutic Activity: 38-52 mins  Taggart Prasad M 11/17/2015, 6:28 PM
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
## 2015-11-18 ENCOUNTER — Inpatient Hospital Stay (HOSPITAL_COMMUNITY)
Admit: 2015-11-18 | Discharge: 2015-11-18 | Disposition: A | Discharge status: Home / Self Care | Payer: BC Managed Care – PPO | Attending: Physician Assistant | Admitting: Physician Assistant

## 2015-11-18 ENCOUNTER — Encounter (HOSPITAL_COMMUNITY): Payer: Self-pay | Admitting: Radiology

## 2015-11-18 ENCOUNTER — Inpatient Hospital Stay (HOSPITAL_COMMUNITY): Payer: BC Managed Care – PPO

## 2015-11-18 ENCOUNTER — Encounter (HOSPITAL_COMMUNITY)
Admission: EM | Disposition: A | Discharge status: Home / Self Care | Payer: Self-pay | Source: Home / Self Care | Attending: Neurology

## 2015-11-18 DIAGNOSIS — E785 Hyperlipidemia, unspecified: Secondary | ICD-10-CM | POA: Diagnosis present

## 2015-11-18 DIAGNOSIS — Q2112 Patent foramen ovale: Secondary | ICD-10-CM

## 2015-11-18 DIAGNOSIS — I340 Nonrheumatic mitral (valve) insufficiency: Secondary | ICD-10-CM

## 2015-11-18 DIAGNOSIS — I6302 Cerebral infarction due to thrombosis of basilar artery: Secondary | ICD-10-CM

## 2015-11-18 DIAGNOSIS — I639 Cerebral infarction, unspecified: Secondary | ICD-10-CM

## 2015-11-18 DIAGNOSIS — Q211 Atrial septal defect: Secondary | ICD-10-CM

## 2015-11-18 HISTORY — PX: TEE WITHOUT CARDIOVERSION: SHX5443

## 2015-11-18 LAB — BETA-2-GLYCOPROTEIN I ABS, IGG/M/A

## 2015-11-18 LAB — CARDIOLIPIN ANTIBODIES, IGG, IGM, IGA: Anticardiolipin IgM: 9 MPL U/mL (ref 0–12)

## 2015-11-18 LAB — PROTEIN C, TOTAL: PROTEIN C, TOTAL: 84 % (ref 60–150)

## 2015-11-18 SURGERY — ECHOCARDIOGRAM, TRANSESOPHAGEAL
Anesthesia: Moderate Sedation

## 2015-11-18 MED ORDER — MIDAZOLAM HCL 5 MG/ML IJ SOLN
INTRAMUSCULAR | Status: AC
  Filled 2015-11-18: qty 2

## 2015-11-18 MED ORDER — MIDAZOLAM HCL 10 MG/2ML IJ SOLN
INTRAMUSCULAR | Status: DC | PRN
  Administered 2015-11-18: 2 mg via INTRAVENOUS
  Administered 2015-11-18: .5 mg via INTRAVENOUS
  Administered 2015-11-18: 1 mg via INTRAVENOUS

## 2015-11-18 MED ORDER — ATORVASTATIN CALCIUM 20 MG PO TABS: 20.0000 mg | ORAL_TABLET | Freq: Every day | ORAL | Status: DC

## 2015-11-18 MED ORDER — IOHEXOL 300 MG/ML  SOLN
100.0000 mL | Freq: Once | INTRAMUSCULAR | Status: AC | PRN
  Administered 2015-11-18: 100 mL via INTRAVENOUS

## 2015-11-18 MED ORDER — ASPIRIN 325 MG PO TABS: 325.0000 mg | ORAL_TABLET | Freq: Every day | ORAL | Status: DC

## 2015-11-18 MED ORDER — BUTAMBEN-TETRACAINE-BENZOCAINE 2-2-14 % EX AERO
INHALATION_SPRAY | CUTANEOUS | Status: DC | PRN
  Administered 2015-11-18: 2 via TOPICAL

## 2015-11-18 MED ORDER — SODIUM CHLORIDE 0.9 % IV SOLN: INTRAVENOUS | Status: DC

## 2015-11-18 MED ORDER — DIPHENHYDRAMINE HCL 50 MG/ML IJ SOLN
INTRAMUSCULAR | Status: AC
  Filled 2015-11-18: qty 1

## 2015-11-18 MED ORDER — FENTANYL CITRATE (PF) 100 MCG/2ML IJ SOLN
INTRAMUSCULAR | Status: DC | PRN
  Administered 2015-11-18: 12.5 ug via INTRAVENOUS
  Administered 2015-11-18: 25 ug via INTRAVENOUS

## 2015-11-18 MED ORDER — FENTANYL CITRATE (PF) 100 MCG/2ML IJ SOLN
INTRAMUSCULAR | Status: AC
  Filled 2015-11-18: qty 2

## 2015-11-18 MED ORDER — BARIUM SULFATE 2.1 % PO SUSP
450.0000 mL | Freq: Once | ORAL | Status: AC
  Administered 2015-11-18: 450 mL via ORAL

## 2015-11-18 NOTE — Progress Notes (Signed)
STROKE TEAM PROGRESS NOTE   SUBJECTIVE (INTERVAL HISTORY) Patient had TEE today which showed no intra-atrial clot but small PFO. Lower extremity venous Doppler study has been ordered. Her diplopia persists.   OBJECTIVE Temp:  [97.8 F (36.6 C)-98.5 F (36.9 C)] 97.8 F (36.6 C) (11/29 1031) Pulse Rate:  [61-85] 68 (11/29 1110) Cardiac Rhythm:  [-] Normal sinus rhythm (11/29 0700) Resp:  [9-19] 17 (11/29 1110) BP: (95-120)/(56-87) 95/64 mmHg (11/29 1110) SpO2:  [95 %-99 %] 98 % (11/29 1110)  CBC:   Recent Labs Lab 11/15/15 0916 11/15/15 0925  WBC 5.4  --   NEUTROABS 3.2  --   HGB 13.4 15.0  HCT 39.9 44.0  MCV 89.3  --   PLT 217  --     Basic Metabolic Panel:   Recent Labs Lab 11/15/15 0916 11/15/15 0925  NA 135 139  K 3.9 4.0  CL 105 103  CO2 23  --   GLUCOSE 107* 109*  BUN 8 9  CREATININE 1.05* 1.00  CALCIUM 8.7*  --     Lipid Panel:     Component Value Date/Time   CHOL 142 11/16/2015 0240   TRIG 94 11/16/2015 0240   HDL 45 11/16/2015 0240   CHOLHDL 3.2 11/16/2015 0240   VLDL 19 11/16/2015 0240   LDLCALC 78 11/16/2015 0240   HgbA1c:  Lab Results  Component Value Date   HGBA1C 5.7* 11/16/2015   Urine Drug Screen:     Component Value Date/Time   LABOPIA NONE DETECTED 11/15/2015 0959   COCAINSCRNUR NONE DETECTED 11/15/2015 0959   LABBENZ NONE DETECTED 11/15/2015 0959   AMPHETMU NONE DETECTED 11/15/2015 0959   THCU NONE DETECTED 11/15/2015 0959   LABBARB NONE DETECTED 11/15/2015 0959     IMAGING  Ct Angio Head W/cm &/or Wo Cm 11/15/2015   1. No medium or large vessel intracranial arterial occlusion.  2. Decreased number of small distal right MCA branch vessels.  3. Widely patent cervical carotid and vertebral arteries.   Ct Head Wo Contrast 11/15/2015   No acute intracranial abnormality. Small mucous retention cyst or polyp right maxillary sinus.   Ct Angio Neck W/cm &/or Wo/cm 11/15/2015   1. No medium or large vessel intracranial  arterial occlusion.  2. Decreased number of small distal right MCA branch vessels.  3. Widely patent cervical carotid and vertebral arteries.   Mr Brain Wo Contrast 11/16/2015   1. Acute ischemic infarcts involving the medial left thalamus and left paramedian midbrain. Finding consistent with perforator territory infarct arising from the left PCA. No hemorrhage or mass effect.  2. Otherwise normal brain MRI.    PHYSICAL EXAM Gen: NAD, conversant, well nourised, obese, well groomed   CV: RRR, no MRG. No Carotid Bruits. No peripheral edema, warm, nontender Eyes: Conjunctivae clear without exudates or hemorrhage Neuro: Detailed Neurologic Exam Speech:    Speech is normal; fluent and spontaneous with normal comprehension.  Cognition:    The patient is oriented to person, place, and time;     recent and remote memory intact;     language fluent;     normal attention, concentration,     fund of knowledge Cranial Nerves:    The pupils are equal, round, and reactive to light.  Visual fields are full to finger confrontation. She has disconjugate gaze  With esotropia of the right eye, impaired left> right upgaze and downgaze, incomplete right abduction with nystagmus on right lateral gaze with fast component to the right. facial sensation  is intact  . The face is symmetric. The palate elevates in the midline. Hearing intact. Voice is normal. Shoulder shrug is normal. The tongue has normal motion without fasciculations.  Motor Observation:    No asymmetry, no atrophy, and no involuntary movements noted. Tone:    Normal muscle tone.   Strength:    Strength is V/V in the upper and lower limbs.  Sensation: dec to LT right  Reflex Exam: DTR's:    Deep tendon reflexes in the upper and lower extremities are normal bilaterally.   Toes:    The toes are downgoing bilaterally.   Clonus:    Clonus is absent. Gait deferred   ASSESSMENT/PLAN Julie Blair is a 44 y.o. female with history of  hepatitis and depression, presenting with diplopia and dysarthria.  She did  receive IV t-PA 60 mg on Saturday 11/15/2015 at 09:45.  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  MRA - not performed  CTA head and neck -  No medium or large vessel intracranial arterial occlusion.Decreased number of small distal right MCA branch vessels.   2D Echo - No source of embolus   TEE with PFO.   PFO (patent foramen ovale) likely an incidental finding. Will check LE venous dopplers for DVT as possible cause of stroke.   Can consider closure at followup. Dr. Leonie Man to discuss with them.  LDL - 78  HgbA1c 5.7  Hypercoag work up labs are either negative or pending  CT abd/pelvis/chest negative for malignancy  VTE prophylaxis - SCDs Diet NPO time specified Except for: Sips with Meds  No antithrombotic prior to admission, now on aspirin 325 mg daily.  Patient counseled to be compliant with her antithrombotic medications  Ongoing aggressive stroke risk factor management  Therapy recommendations:  OP PT and OT  Disposition: home with OP therapies   Hypertension  Blood pressure tends to run low  Hyperlipidemia  Home meds: No lipid lowering medications prior to admission  LDL 78, goal < 70  Add Lipitor 20 mg daily  Continue statin at discharge  Other Stroke Risk Factors  Cigarette smoker, quit smoking 20 years ago. Was a social smoker, no significant tobacco use maybe a few cigarettes socially on occassion  ETOH use - 1 ounce per week  Other Active Problems  Mild hypotension  Hospital day # [place] for Pager information 11/18/2015 11:15 AM  I have personally examined this patient, reviewed notes, independently viewed imaging  studies, participated in medical decision making and plan of care. I have made any additions or clarifications directly to the above note. Agree with note above. TEE shows a small PFO. I discussed the case with Dr. Burt Knack cardiologist hand plan to do a transcranial Doppler bubble study to further categorize the shunt. If it is a large shunt may consider possible endovascular closure benefits of small shunt recommend conservative medical therapy. I had a long discussion with the patient's husband as well as mother and patient and answered questions. Likely discharge home on aspirin and lower extremity venous Doppler is negative for DVT. Follow-up as an outpatient in stroke clinic in 2 months. Antony Contras, MD Medical Director [facility] Pager: [phone] 11/18/2015 1:19 PM  To contact Stroke Continuity provider, please refer to  [email or web address] After hours, contact General Neurology

## 2015-11-18 NOTE — Interval H&P Note (Signed)
History and Physical Interval Note:  11/18/2015 10:54 AM  Julie Blair  has presented today for surgery, with the diagnosis of STROKE  The various methods of treatment have been discussed with the patient and family. After consideration of risks, benefits and other options for treatment, the patient has consented to  Procedure(s): TRANSESOPHAGEAL ECHOCARDIOGRAM (TEE) (N/A) as a surgical intervention .  The patient's history has been reviewed, patient examined, no change in status, stable for surgery.  I have reviewed the patient's chart and labs.  Questions were answered to the patient's satisfaction.     Sharol Harness, MD 11/18/2015 10:54 AM

## 2015-11-18 NOTE — Progress Notes (Signed)
VASCULAR LAB PRELIMINARY  PRELIMINARY  PRELIMINARY  PRELIMINARY  Bilateral lower extremity venous duplex completed.    Preliminary report:  Bilateral:  No evidence of DVT, superficial thrombosis, or Baker's Cyst.   Dorthey Depace, RVS 11/18/2015, 12:56 PM

## 2015-11-18 NOTE — Progress Notes (Signed)
NURSING PROGRESS NOTE  [name] [id number] Discharge Data: 11/18/2015 5:34 PM Attending Provider: No att. providers found [id number], DAVID Audelia Acton, PA-C   [name] to be D/C'd Home per MD order.    All IV's will be discontinued and monitored for bleeding.  All belongings will be returned to patient for patient to take home.  Last Documented Vital Signs:  Blood pressure 110/86, pulse 64, temperature 98.2 F (36.8 C), temperature source Oral, resp. rate 18, height 5\' 3"  (1.6 m), weight 68.1 kg (150 lb 2.1 oz), last menstrual period 10/25/2015, SpO2 98 %.  Joslyn Hy, MSN, RN, [employer]

## 2015-11-18 NOTE — Progress Notes (Signed)
                   TRANSCRANIAL DOPPLER BUBBLE STUDY   [name] Date of Birth:  October 11, 1971 Medical Record Number:  [id number]   Indications: Diagnostic Date of Procedure: 11/18/2015 Clinical History:  71 year lady with cryptogenic stroke Technical Description:   Transcranial Doppler Bubble Study was performed at the bedside after taking written informed consent from the patient and explaining risk/benefits. Both middle cerebral arteries were insonated using a headset. And IV line had been previously inserted in the left wrist by the RN using aseptic precautions. Agitated saline injection at rest and after valsalva maneuver did  result in  Multiple high intensity transient signals (HITS) but without frank curtain sign..   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   Results were explained to the patient. Questions were answered.I had a long discussion with the patient and mother with regards to the role of patent foramen ovale   and risk of stroke. It is unclear at the present time whether PFO closure leads to better secondary stroke prevention or not. There are ongoing clinical trials which are trying to address this issue. There was no clear antecedent activity related to straining prior to onset of her stroke symptoms. I would recommend antiplatelet therapy with aspirin for now. She was advised to follow-up with me as an outpatient in 2 months  Antony Contras, MD
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
## 2015-11-18 NOTE — H&P (View-Only) (Signed)
    [facility] has been requested to perform a transesophageal echocardiogram on Julie Blair to r/o embolic source of stroke.  After careful review of history and examination, the risks and benefits of transesophageal echocardiogram have been explained including risks of esophageal damage, perforation (1:10,000 risk), bleeding, pharyngeal hematoma as well as other potential complications associated with conscious sedation including aspiration, arrhythmia, respiratory failure and death. Alternatives to treatment were discussed, questions were answered. Patient is willing to proceed.  TEE - Dr. Oval Linsey @ 1100 . NPO after midnight. Meds with sips.   Antoney Biven, PA-C 11/17/2015 3:12 PM

## 2015-11-18 NOTE — CV Procedure (Signed)
Brief TEE Report  LVEF >55% No LA/LAA thrombus or masses Mild MR, trivial TR  For further details see full report.  Betsey Sossamon C. Oval Linsey, MD  11/18/2015 11:37 AM

## 2015-11-18 NOTE — Progress Notes (Signed)
Occupational Therapy Treatment Patient Details Name: Julie Blair MRN: [id number] DOB: [birth year] Today's Date: 11/18/2015    History of present illness Julie Blair is an 44 y.o. 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   OT comments  Pt with improvement in EOMs and control of occulomotor movements.  She continues with diplopia.  Was able to reduce occlusion to spot occlusion and pt instructed how to modify this on her own.  She is independent with HEP  Follow Up Recommendations  Outpatient OT;Supervision/Assistance - 24 hour    Equipment Recommendations  None recommended by OT    Recommendations for Other Services      Precautions / Restrictions Precautions Precautions: Fall Precaution Comments: diplopia persists       Mobility Bed Mobility Overal bed mobility: Independent                Transfers Overall transfer level: Modified independent                    Balance Overall balance assessment: Modified Independent                                 ADL Overall ADL's : Modified independent                                              Vision                 Additional Comments: Pt reports she has been independently performing vision exercises independently.  She reports subjective improvement of vision and reports she was able to watch TV last evening with occlusion glasses, which she was not able to do previously.   She demonstrates improved ability to track superiorly with both eyes today and increase in control with adduction of both eyes.  Modified nasal occlusion of glasses to patch occlusion (smaller area of occlusion) with good success.  Pt instructed how to modify the occlusion on her own.   Reviewed therapeutic activities and safety  at home.       Perception     Praxis      Cognition   Behavior During Therapy: WFL for tasks assessed/performed Overall Cognitive Status: Within Functional Limits for tasks assessed                       Extremity/Trunk Assessment               Exercises Other Exercises Other Exercises: Pt instructed in activities she can perform at home to improve EOMs - balloon volleyball, tossing balls with children, playing video games.  She is independent with current HEP   Shoulder Instructions       General Comments      Pertinent Vitals/ Pain       Pain Assessment: No/denies pain  Home Living                                          Prior Functioning/Environment  Frequency Min 3X/week     Progress Toward Goals  OT Goals(current goals can now be found in the care plan section)  Progress towards OT goals: Progressing toward goals  ADL Goals Additional ADL Goal #1: Pt will be independent with HEP for vision   Plan Discharge plan remains appropriate;Equipment recommendations need to be updated    Co-evaluation                 End of Session     Activity Tolerance Patient tolerated treatment well   Patient Left with call bell/phone within reach;in chair   Nurse Communication          Time: [id number] OT Time Calculation (min): 30 min  Charges: OT General Charges $OT Visit: 1 Procedure OT Treatments $Therapeutic Activity: 23-37 mins  Leodan Bolyard M 11/18/2015, 4:10 PM

## 2015-11-19 ENCOUNTER — Encounter (HOSPITAL_COMMUNITY): Payer: Self-pay | Admitting: Cardiovascular Disease

## 2015-11-19 LAB — LUPUS ANTICOAGULANT PANEL
DRVVT: 34.2 s (ref 0.0–44.0)
PTT LA: 33 s (ref 0.0–40.6)

## 2015-11-19 LAB — PROTEIN S, TOTAL: Protein S Ag, Total: 101 % (ref 60–150)

## 2015-11-19 LAB — PROTEIN C ACTIVITY: PROTEIN C ACTIVITY: 100 % (ref 73–180)

## 2015-11-19 LAB — PROTEIN S ACTIVITY: Protein S Activity: 86 % (ref 63–140)

## 2015-11-20 ENCOUNTER — Telehealth: Payer: Self-pay | Admitting: Neurology

## 2015-11-20 LAB — FACTOR 5 LEIDEN

## 2015-11-20 NOTE — Telephone Encounter (Signed)
I called and left a message on the patient's mobile phone number to call me back to discuss her PFO next week when I'm in the office

## 2015-11-20 NOTE — Telephone Encounter (Signed)
Rn call patient back about a hole in her heart. Rn explain that Dr.Sethi does not do heart issues or surgery. Rn explain she had cardiac work up at [facility] per [employer]. Rn stated per via epic her d/c note stated to follow up with Dr.Sethi and Dr. Glade Lloyd. Pt has appt with Dr.Sethi in February 2017. Rn told patient to follow up Chana Bode and schedule an appointment. Rn stated Dr.Sethi will give her call to give some insight about the hole in the heart, on who to contact about that issue. Pt stated verbalized she understood. Rn will forward message to patient.

## 2015-11-20 NOTE — Telephone Encounter (Signed)
Patient is calling. She was seen in the hospital on 11-15-15 and discharged on 11-18-15 with a stroke. She was told in the hospital that she has a hole in her heart and she has questions and concerns about this.The patient has an appointment with Dr. Leonie Man in February. Please call to discuss. Thank you.

## 2015-11-22 LAB — PROTHROMBIN GENE MUTATION

## 2015-11-24 ENCOUNTER — Institutional Professional Consult (permissible substitution): Payer: BC Managed Care – PPO | Admitting: Medical

## 2015-11-24 NOTE — Telephone Encounter (Signed)
I called the patient at the request of my nurse to return her call but again I found an answering machine and had to leave a message. She needs to have a follow-up appointment to see me or Dr. Erlinda Hong to discuss her issue about PFO and stroke risk

## 2015-11-25 ENCOUNTER — Other Ambulatory Visit: Payer: Self-pay | Admitting: Neurology

## 2015-11-25 ENCOUNTER — Telehealth: Payer: Self-pay | Admitting: Neurology

## 2015-11-25 DIAGNOSIS — I6350 Cerebral infarction due to unspecified occlusion or stenosis of unspecified cerebral artery: Secondary | ICD-10-CM

## 2015-11-25 NOTE — Telephone Encounter (Signed)
Referral sent to neuro rehab PT and OT via epic.

## 2015-11-25 NOTE — Telephone Encounter (Signed)
Pt called and is requesting outpt OT and PT with [facility]. Please call and advise [phone]

## 2015-11-25 NOTE — Telephone Encounter (Signed)
Rn call patient back about orders for PT and OT and Neuor rehab. Pt stated she has an appt with her PCP next week as a new pt consult. Pt stated she will be discussing with her PCP about the hole in her heart. Pt states she feels pretty good and is out of work for now. Rn stated Dr.Sethi will put orders in for PT and OT.

## 2015-11-27 ENCOUNTER — Telehealth: Payer: Self-pay | Admitting: Rehabilitation

## 2015-11-27 ENCOUNTER — Encounter: Payer: Self-pay | Admitting: Rehabilitation

## 2015-11-27 ENCOUNTER — Ambulatory Visit: Payer: BC Managed Care – PPO | Admitting: Rehabilitation

## 2015-11-27 ENCOUNTER — Ambulatory Visit: Payer: BC Managed Care – PPO | Attending: Neurology | Admitting: Occupational Therapy

## 2015-11-27 ENCOUNTER — Encounter: Payer: Self-pay | Admitting: Occupational Therapy

## 2015-11-27 DIAGNOSIS — R41841 Cognitive communication deficit: Secondary | ICD-10-CM | POA: Insufficient documentation

## 2015-11-27 DIAGNOSIS — R42 Dizziness and giddiness: Secondary | ICD-10-CM | POA: Insufficient documentation

## 2015-11-27 DIAGNOSIS — H539 Unspecified visual disturbance: Secondary | ICD-10-CM | POA: Diagnosis not present

## 2015-11-27 DIAGNOSIS — I63532 Cerebral infarction due to unspecified occlusion or stenosis of left posterior cerebral artery: Secondary | ICD-10-CM | POA: Diagnosis present

## 2015-11-27 DIAGNOSIS — R279 Unspecified lack of coordination: Secondary | ICD-10-CM | POA: Insufficient documentation

## 2015-11-27 DIAGNOSIS — H05829 Myopathy of extraocular muscles, unspecified orbit: Secondary | ICD-10-CM

## 2015-11-27 DIAGNOSIS — R278 Other lack of coordination: Secondary | ICD-10-CM

## 2015-11-27 DIAGNOSIS — I69319 Unspecified symptoms and signs involving cognitive functions following cerebral infarction: Secondary | ICD-10-CM | POA: Diagnosis present
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
## 2015-11-27 NOTE — Telephone Encounter (Signed)
Dr. Leonie Man,   I evaluated Julie Blair this morning for PT.  I did not feel that she needed any continued PT services, however she did feel that her higher level cognition was impacted with CVA and does not feel that she can return to work at this point, therefore I would like to request an SLP cognitive evaluation.  Please place order if you agree.    Thanks,  Cameron Sprang, PT, MPT [facility] [address] [place], [place], [address] Phone: [phone]   Fax:  [phone] 11/27/2015, 1:11 PM

## 2015-11-27 NOTE — Telephone Encounter (Signed)
Ok will place ST order
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
## 2015-12-01 ENCOUNTER — Ambulatory Visit: Payer: BC Managed Care – PPO | Admitting: Occupational Therapy

## 2015-12-01 ENCOUNTER — Encounter: Payer: Self-pay | Admitting: Occupational Therapy

## 2015-12-01 DIAGNOSIS — I63532 Cerebral infarction due to unspecified occlusion or stenosis of left posterior cerebral artery: Secondary | ICD-10-CM

## 2015-12-01 DIAGNOSIS — H539 Unspecified visual disturbance: Secondary | ICD-10-CM

## 2015-12-01 DIAGNOSIS — H05829 Myopathy of extraocular muscles, unspecified orbit: Secondary | ICD-10-CM

## 2015-12-01 DIAGNOSIS — R279 Unspecified lack of coordination: Secondary | ICD-10-CM

## 2015-12-01 DIAGNOSIS — I69319 Unspecified symptoms and signs involving cognitive functions following cerebral infarction: Secondary | ICD-10-CM

## 2015-12-01 DIAGNOSIS — R278 Other lack of coordination: Secondary | ICD-10-CM

## 2015-12-01 NOTE — Patient Instructions (Signed)
Eye exercises: Always respect when your eyes feel tired or when you feel even a slight headache coming on. When you feel this, STOP and give your brain a rest. Then you can try again.   1.  Occlude one eye. Use a bright object to track with the other eye.  Track up and down, left and right, and then diagonally. Do 5 repetitions in each direction. Then switch eyes and repeat.  Do these 2-3 times per day.    2. Hold a bright object in front of you. Place it in the middle where you only see one image.SLOWLY move object to the left until you just start to see a second image.  Hold the object still and see if you can make one image. If you can't bring it slightly back toward the middle and try again.  Practice this 2-3 minutes/ 3 times per day.
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
## 2015-12-02 ENCOUNTER — Ambulatory Visit: Payer: BC Managed Care – PPO

## 2015-12-02 DIAGNOSIS — R41841 Cognitive communication deficit: Secondary | ICD-10-CM

## 2015-12-02 DIAGNOSIS — H539 Unspecified visual disturbance: Secondary | ICD-10-CM | POA: Diagnosis not present

## 2015-12-02 NOTE — Patient Instructions (Signed)
Divided Attention ideas - any can be done together  Playing a card game either by yourself or with someone  Playing a board game with someone  Sorting playing cards  Writing down commercials on TV or the radio  Writing down news stories on TV or radio  Working outside with yard work, gardening, Social research officer, government.  [employer]  Recite state names and capitals  Complete long division or 2 or 3 digit multiplication problems  Listen to a Psychologist, sport and exercise (i.e., YouTube), or online news ([place], CNN, etc.), or to a family member give a story, and give pertinent information from the speech, news story, or conversation or story after it is over  Empty the dishwasher  Complete paper/pencil puzzles (sudoku, crosswords, word search)  Complete puzzles online (www.lumosity.com -computer/tablet based, or www.constanttherapy.com - tablet based)  Therapy worksheets/exercises  Put together a puzzle  State coins needed to make a certain change amount  Have someone read a portion of a novel to you, and you give a summary  Call out the total change for combinations of coins   START AT A LEVEL THAT YOU CAN SEE SOME SUCCESS BUT THAT YOU ARE ALSO CHALLENGED BY
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
## 2015-12-04 ENCOUNTER — Ambulatory Visit: Payer: BC Managed Care – PPO | Admitting: Occupational Therapy

## 2015-12-04 DIAGNOSIS — R279 Unspecified lack of coordination: Secondary | ICD-10-CM

## 2015-12-04 DIAGNOSIS — R278 Other lack of coordination: Secondary | ICD-10-CM

## 2015-12-04 DIAGNOSIS — H539 Unspecified visual disturbance: Secondary | ICD-10-CM

## 2015-12-04 DIAGNOSIS — H05829 Myopathy of extraocular muscles, unspecified orbit: Secondary | ICD-10-CM
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
## 2015-12-04 NOTE — Patient Instructions (Signed)
  Visual Activities:  1. Word search 2. Read for short periods of time 3. With someone with you, look for items in grocery store 4. Work simple jigsaw puzzles 5. Play simple matching games online/tablet that make you search visually. 6.  Play board/card games (memory/matching, solitaire, connect 4, etc.)  7.  Have someone place sticky notes/cards around the house ("hidden") and then search for them 8. Balloon volleyball

## 2015-12-05 ENCOUNTER — Ambulatory Visit (INDEPENDENT_AMBULATORY_CARE_PROVIDER_SITE_OTHER): Payer: BC Managed Care – PPO

## 2015-12-05 ENCOUNTER — Ambulatory Visit (INDEPENDENT_AMBULATORY_CARE_PROVIDER_SITE_OTHER): Payer: BC Managed Care – PPO | Admitting: Cardiovascular Disease

## 2015-12-05 ENCOUNTER — Encounter: Payer: Self-pay | Admitting: Cardiovascular Disease

## 2015-12-05 VITALS — BP 128/72 | HR 77 | Ht 63.0 in | Wt 147.8 lb

## 2015-12-05 DIAGNOSIS — I639 Cerebral infarction, unspecified: Secondary | ICD-10-CM

## 2015-12-05 DIAGNOSIS — R002 Palpitations: Secondary | ICD-10-CM

## 2015-12-05 DIAGNOSIS — Q211 Atrial septal defect: Secondary | ICD-10-CM | POA: Diagnosis not present

## 2015-12-05 DIAGNOSIS — Q2112 Patent foramen ovale: Secondary | ICD-10-CM
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
## 2015-12-05 NOTE — Patient Instructions (Signed)
Medication Instructions:  Your physician recommends that you continue on your current medications as directed. Please refer to the Current Medication list given to you today.  Labwork: No new orders.   Testing/Procedures: Your physician has recommended that you wear an event monitor. Event monitors are medical devices that record the heart's electrical activity. Doctors most often [place] these monitors to diagnose arrhythmias. Arrhythmias are problems with the speed or rhythm of the heartbeat. The monitor is a small, portable device. You can wear one while you do your normal daily activities. This is usually used to diagnose what is causing palpitations/syncope (passing out).  Follow-Up: Your physician wants you to follow-up in: 1 YEAR with Dr Burt Knack.  You will receive a reminder letter in the mail two months in advance. If you don't receive a letter, please call our office to schedule the follow-up appointment.   Any Other Special Instructions Will Be Listed Below (If Applicable).     If you need a refill on your cardiac medications before your next appointment, please call your pharmacy.

## 2015-12-08 ENCOUNTER — Ambulatory Visit: Payer: BC Managed Care – PPO

## 2015-12-08 ENCOUNTER — Ambulatory Visit: Payer: BC Managed Care – PPO | Admitting: Occupational Therapy

## 2015-12-08 DIAGNOSIS — H05829 Myopathy of extraocular muscles, unspecified orbit: Secondary | ICD-10-CM

## 2015-12-08 DIAGNOSIS — H539 Unspecified visual disturbance: Secondary | ICD-10-CM | POA: Diagnosis not present

## 2015-12-08 DIAGNOSIS — R279 Unspecified lack of coordination: Secondary | ICD-10-CM

## 2015-12-08 DIAGNOSIS — R41841 Cognitive communication deficit: Secondary | ICD-10-CM

## 2015-12-08 DIAGNOSIS — R278 Other lack of coordination: Secondary | ICD-10-CM
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
## 2015-12-08 NOTE — Patient Instructions (Signed)
Continue with divided attention tasks at home

## 2015-12-09 ENCOUNTER — Ambulatory Visit: Payer: BC Managed Care – PPO

## 2015-12-11 NOTE — Addendum Note (Signed)
Addended by: Cameron Sprang A on: 12/11/2015 12:52 PM   Modules accepted: Orders

## 2015-12-17 ENCOUNTER — Ambulatory Visit: Payer: BC Managed Care – PPO | Admitting: Rehabilitation

## 2015-12-17 ENCOUNTER — Encounter: Payer: Self-pay | Admitting: Rehabilitation

## 2015-12-17 DIAGNOSIS — R42 Dizziness and giddiness: Secondary | ICD-10-CM

## 2015-12-17 DIAGNOSIS — H539 Unspecified visual disturbance: Secondary | ICD-10-CM | POA: Diagnosis not present

## 2015-12-17 NOTE — Patient Instructions (Signed)
Gaze Stabilization: Standing Feet Apart    Feet shoulder width apart, keeping eyes on target on wall __3__ feet away, tilt head down 15-30 and move head side to side for _10___ reps. Repeat while moving head up and down for _10___ reps.   Then repeat in diagonal patter (both ways) x 10 reps.  Only increase your dizziness 2-3 points from your baseline.  For example, if you begin with 2/10 dizziness, stop and rest if you get to 5/10.  Place letter on solid background to start with.   Do __2__ sessions per day.   Copyright  VHI. All rights reserved.
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
## 2015-12-18 ENCOUNTER — Ambulatory Visit: Payer: BC Managed Care – PPO

## 2015-12-18 ENCOUNTER — Ambulatory Visit: Payer: BC Managed Care – PPO | Admitting: Occupational Therapy

## 2015-12-18 DIAGNOSIS — I69319 Unspecified symptoms and signs involving cognitive functions following cerebral infarction: Secondary | ICD-10-CM

## 2015-12-18 DIAGNOSIS — R41841 Cognitive communication deficit: Secondary | ICD-10-CM

## 2015-12-18 DIAGNOSIS — H539 Unspecified visual disturbance: Secondary | ICD-10-CM

## 2015-12-18 DIAGNOSIS — H05829 Myopathy of extraocular muscles, unspecified orbit: Secondary | ICD-10-CM

## 2015-12-18 NOTE — Patient Instructions (Signed)
Expand the breadth of what you are doing to practice divided attention at home.
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
## 2015-12-23 ENCOUNTER — Encounter: Payer: BC Managed Care – PPO | Admitting: Occupational Therapy

## 2015-12-25 ENCOUNTER — Ambulatory Visit: Payer: BC Managed Care – PPO | Attending: Neurology | Admitting: Occupational Therapy

## 2015-12-25 ENCOUNTER — Ambulatory Visit: Payer: BC Managed Care – PPO

## 2015-12-25 DIAGNOSIS — R42 Dizziness and giddiness: Secondary | ICD-10-CM | POA: Diagnosis present

## 2015-12-25 DIAGNOSIS — R41841 Cognitive communication deficit: Secondary | ICD-10-CM

## 2015-12-25 DIAGNOSIS — F4322 Adjustment disorder with anxiety: Secondary | ICD-10-CM | POA: Diagnosis present

## 2015-12-25 DIAGNOSIS — H05829 Myopathy of extraocular muscles, unspecified orbit: Secondary | ICD-10-CM

## 2015-12-25 DIAGNOSIS — I69319 Unspecified symptoms and signs involving cognitive functions following cerebral infarction: Secondary | ICD-10-CM | POA: Diagnosis present

## 2015-12-25 DIAGNOSIS — R279 Unspecified lack of coordination: Secondary | ICD-10-CM | POA: Insufficient documentation

## 2015-12-25 DIAGNOSIS — R278 Other lack of coordination: Secondary | ICD-10-CM

## 2015-12-25 DIAGNOSIS — H539 Unspecified visual disturbance: Secondary | ICD-10-CM | POA: Diagnosis not present
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
## 2015-12-26 ENCOUNTER — Encounter: Payer: Self-pay | Admitting: Rehabilitation

## 2015-12-26 ENCOUNTER — Ambulatory Visit: Payer: BC Managed Care – PPO | Admitting: Rehabilitation

## 2015-12-26 DIAGNOSIS — R42 Dizziness and giddiness: Secondary | ICD-10-CM

## 2015-12-26 DIAGNOSIS — H539 Unspecified visual disturbance: Secondary | ICD-10-CM | POA: Diagnosis not present
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
## 2015-12-30 ENCOUNTER — Ambulatory Visit: Payer: BC Managed Care – PPO | Admitting: Physical Therapy

## 2015-12-30 ENCOUNTER — Encounter: Payer: Self-pay | Admitting: Physical Therapy

## 2015-12-30 ENCOUNTER — Ambulatory Visit: Payer: BC Managed Care – PPO | Admitting: Occupational Therapy

## 2015-12-30 ENCOUNTER — Ambulatory Visit: Payer: BC Managed Care – PPO

## 2015-12-30 DIAGNOSIS — R278 Other lack of coordination: Secondary | ICD-10-CM

## 2015-12-30 DIAGNOSIS — H539 Unspecified visual disturbance: Secondary | ICD-10-CM

## 2015-12-30 DIAGNOSIS — H05829 Myopathy of extraocular muscles, unspecified orbit: Secondary | ICD-10-CM

## 2015-12-30 DIAGNOSIS — I69319 Unspecified symptoms and signs involving cognitive functions following cerebral infarction: Secondary | ICD-10-CM

## 2015-12-30 DIAGNOSIS — R41841 Cognitive communication deficit: Secondary | ICD-10-CM

## 2015-12-30 DIAGNOSIS — R279 Unspecified lack of coordination: Secondary | ICD-10-CM

## 2015-12-30 DIAGNOSIS — R42 Dizziness and giddiness: Secondary | ICD-10-CM
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
## 2015-12-30 NOTE — Patient Instructions (Signed)
   Hold/place 2 targets about shoulder width apart.  Look from one to the other.  10x in each of these positions  Horizontally  Vertically  Diagonally (each direction)
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
## 2015-12-30 NOTE — Patient Instructions (Signed)
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
## 2015-12-31 ENCOUNTER — Ambulatory Visit: Payer: BC Managed Care – PPO | Admitting: Rehabilitation

## 2016-01-02 ENCOUNTER — Ambulatory Visit: Payer: BC Managed Care – PPO | Admitting: Rehabilitation

## 2016-01-02 ENCOUNTER — Encounter: Payer: Self-pay | Admitting: Rehabilitation

## 2016-01-02 DIAGNOSIS — H539 Unspecified visual disturbance: Secondary | ICD-10-CM | POA: Diagnosis not present

## 2016-01-02 DIAGNOSIS — R42 Dizziness and giddiness: Secondary | ICD-10-CM
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
## 2016-01-05 ENCOUNTER — Encounter: Payer: Self-pay | Admitting: Neurology

## 2016-01-05 ENCOUNTER — Ambulatory Visit (INDEPENDENT_AMBULATORY_CARE_PROVIDER_SITE_OTHER): Payer: BC Managed Care – PPO | Admitting: Neurology

## 2016-01-05 VITALS — BP 114/60 | HR 98 | Ht 63.0 in | Wt 147.0 lb

## 2016-01-05 DIAGNOSIS — Q211 Atrial septal defect: Secondary | ICD-10-CM | POA: Diagnosis not present

## 2016-01-05 DIAGNOSIS — E785 Hyperlipidemia, unspecified: Secondary | ICD-10-CM | POA: Diagnosis not present

## 2016-01-05 DIAGNOSIS — Q2112 Patent foramen ovale: Secondary | ICD-10-CM

## 2016-01-05 DIAGNOSIS — I6302 Cerebral infarction due to thrombosis of basilar artery: Secondary | ICD-10-CM | POA: Diagnosis not present

## 2016-01-05 NOTE — Patient Instructions (Signed)
You are on the appropriate management Continue aspirin 325mg  daily Continue Lipitor (LDL goal should be less than 70) Mediterranean diet    Why follow it? Research shows. . Those who follow the Mediterranean diet have a reduced risk of heart disease  . The diet is associated with a reduced incidence of Parkinson's and Alzheimer's diseases . People following the diet may have longer life expectancies and lower rates of chronic diseases  . The Dietary Guidelines for Americans recommends the Mediterranean diet as an eating plan to promote health and prevent disease  What Is the Mediterranean Diet?  . Healthy eating plan based on typical foods and recipes of Mediterranean-style cooking . The diet is primarily a plant based diet; these foods should make up a majority of meals   Starches - Plant based foods should make up a majority of meals - They are an important sources of vitamins, minerals, energy, antioxidants, and fiber - Choose whole grains, foods high in fiber and minimally processed items  - Typical grain sources include wheat, oats, barley, corn, brown rice, bulgar, farro, millet, polenta, couscous  - Various types of beans include chickpeas, lentils, fava beans, black beans, white beans   Fruits  Veggies - Large quantities of antioxidant rich fruits & veggies; 6 or more servings  - Vegetables can be eaten raw or lightly drizzled with oil and cooked  - Vegetables common to the traditional Mediterranean Diet include: artichokes, arugula, beets, broccoli, brussel sprouts, cabbage, carrots, celery, collard greens, cucumbers, eggplant, kale, leeks, lemons, lettuce, mushrooms, okra, onions, peas, peppers, potatoes, pumpkin, radishes, rutabaga, shallots, spinach, sweet potatoes, turnips, zucchini - Fruits common to the Mediterranean Diet include: apples, apricots, avocados, cherries, clementines, dates, figs, grapefruits, grapes, melons, nectarines, oranges, peaches, pears, pomegranates,  strawberries, tangerines  Fats - Replace butter and margarine with healthy oils, such as olive oil, canola oil, and tahini  - Limit nuts to no more than a handful a day  - Nuts include walnuts, almonds, pecans, pistachios, pine nuts  - Limit or avoid candied, honey roasted or heavily salted nuts - Olives are central to the [employer] - can be eaten whole or used in a variety of dishes   Meats Protein - Limiting red meat: no more than a few times a month - When eating red meat: choose lean cuts and keep the portion to the size of deck of cards - Eggs: approx. 0 to 4 times a week  - Fish and lean poultry: at least 2 a week  - Healthy protein sources include, chicken, [place], lean beef, lamb - Increase intake of seafood such as tuna, salmon, trout, mackerel, shrimp, scallops - Avoid or limit high fat processed meats such as sausage and bacon  Dairy - Include moderate amounts of low fat dairy products  - Focus on healthy dairy such as fat free yogurt, skim milk, low or reduced fat cheese - Limit dairy products higher in fat such as whole or 2% milk, cheese, ice cream  Alcohol - Moderate amounts of red wine is ok  - No more than 5 oz daily for women (all ages) and men older than age 46  - No more than 10 oz of wine daily for men younger than 106  Other - Limit sweets and other desserts  - Use herbs and spices instead of salt to flavor foods  - Herbs and spices common to the traditional Mediterranean Diet include: basil, bay leaves, chives, cloves, cumin, fennel, garlic, lavender, marjoram, mint, oregano,  parsley, pepper, rosemary, sage, savory, sumac, tarragon, thyme   It's not just a diet, it's a lifestyle:  . The Mediterranean diet includes lifestyle factors typical of those in the region  . Foods, drinks and meals are best eaten with others and savored . Daily physical activity is important for overall good health . This could be strenuous exercise like running and aerobics . This  could also be more leisurely activities such as walking, housework, yard-work, or taking the stairs . Moderation is the key; a balanced and healthy diet accommodates most foods and drinks . Consider portion sizes and frequency of consumption of certain foods   Meal Ideas & Options:  . Breakfast:  o Whole wheat toast or whole wheat English muffins with peanut butter & hard boiled egg o Steel cut oats topped with apples & cinnamon and skim milk  o Fresh fruit: banana, strawberries, melon, berries, peaches  o Smoothies: strawberries, bananas, greek yogurt, peanut butter o Low fat greek yogurt with blueberries and granola  o Egg white omelet with spinach and mushrooms o Breakfast couscous: whole wheat couscous, apricots, skim milk, cranberries  . Sandwiches:  o Hummus and grilled vegetables (peppers, zucchini, squash) on whole wheat bread   o Grilled chicken on whole wheat pita with lettuce, tomatoes, cucumbers or tzatziki  o Tuna salad on whole wheat bread: tuna salad made with greek yogurt, olives, red peppers, capers, green onions o Garlic rosemary lamb pita: lamb sauted with garlic, rosemary, salt & pepper; add lettuce, cucumber, greek yogurt to pita - flavor with lemon juice and black pepper  . Seafood:  o Mediterranean grilled salmon, seasoned with garlic, basil, parsley, lemon juice and black pepper o Shrimp, lemon, and spinach whole-grain pasta salad made with low fat greek yogurt  o Seared scallops with lemon orzo  o Seared tuna steaks seasoned salt, pepper, coriander topped with tomato mixture of olives, tomatoes, olive oil, minced garlic, parsley, green onions and cappers  . Meats:  o Herbed greek chicken salad with kalamata olives, cucumber, feta  o Red bell peppers stuffed with spinach, bulgur, lean ground beef (or lentils) & topped with feta   o Kebabs: skewers of chicken, tomatoes, onions, zucchini, squash  o [place] burgers: made with red onions, mint, dill, lemon juice, feta  cheese topped with roasted red peppers . Vegetarian o Cucumber salad: cucumbers, artichoke hearts, celery, red onion, feta cheese, tossed in olive oil & lemon juice  o Hummus and whole grain pita points with a greek salad (lettuce, tomato, feta, olives, cucumbers, red onion) o Lentil soup with celery, carrots made with vegetable broth, garlic, salt and pepper  o Tabouli salad: parsley, bulgur, mint, scallions, cucumbers, tomato, radishes, lemon juice, olive oil, salt and pepper. Continue with PT and OT.  Consider neuropsychological testing as well as therapy with Dr. Antionette Poles for cognitive issues and anxiety Follow up with Dr. Tilden Dome

## 2016-01-05 NOTE — Progress Notes (Signed)
Chart forwarded.  
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
## 2016-01-06 ENCOUNTER — Encounter: Payer: BC Managed Care – PPO | Admitting: Occupational Therapy

## 2016-01-06 ENCOUNTER — Ambulatory Visit: Payer: BC Managed Care – PPO | Admitting: Physical Therapy

## 2016-01-06 ENCOUNTER — Ambulatory Visit: Payer: BC Managed Care – PPO

## 2016-01-06 DIAGNOSIS — R41841 Cognitive communication deficit: Secondary | ICD-10-CM

## 2016-01-06 DIAGNOSIS — R42 Dizziness and giddiness: Secondary | ICD-10-CM

## 2016-01-06 DIAGNOSIS — H539 Unspecified visual disturbance: Secondary | ICD-10-CM | POA: Diagnosis not present

## 2016-01-06 NOTE — Patient Instructions (Signed)
Continue with divided attention tasks at home, varying the tasks worked on.
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
## 2016-01-07 ENCOUNTER — Encounter: Payer: Self-pay | Admitting: Physical Therapy
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
## 2016-01-08 ENCOUNTER — Ambulatory Visit: Payer: BC Managed Care – PPO | Admitting: Rehabilitation

## 2016-01-08 ENCOUNTER — Ambulatory Visit: Payer: BC Managed Care – PPO | Admitting: Occupational Therapy

## 2016-01-08 ENCOUNTER — Encounter: Payer: Self-pay | Admitting: Rehabilitation

## 2016-01-08 DIAGNOSIS — R278 Other lack of coordination: Secondary | ICD-10-CM

## 2016-01-08 DIAGNOSIS — H539 Unspecified visual disturbance: Secondary | ICD-10-CM | POA: Diagnosis not present

## 2016-01-08 DIAGNOSIS — I69319 Unspecified symptoms and signs involving cognitive functions following cerebral infarction: Secondary | ICD-10-CM

## 2016-01-08 DIAGNOSIS — H05829 Myopathy of extraocular muscles, unspecified orbit: Secondary | ICD-10-CM

## 2016-01-08 DIAGNOSIS — R42 Dizziness and giddiness: Secondary | ICD-10-CM

## 2016-01-08 DIAGNOSIS — R279 Unspecified lack of coordination: Secondary | ICD-10-CM
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
## 2016-01-13 ENCOUNTER — Ambulatory Visit: Payer: BC Managed Care – PPO

## 2016-01-13 ENCOUNTER — Telehealth: Payer: Self-pay

## 2016-01-13 ENCOUNTER — Ambulatory Visit: Payer: BC Managed Care – PPO | Admitting: Physical Therapy

## 2016-01-13 DIAGNOSIS — H539 Unspecified visual disturbance: Secondary | ICD-10-CM | POA: Diagnosis not present

## 2016-01-13 DIAGNOSIS — R41841 Cognitive communication deficit: Secondary | ICD-10-CM

## 2016-01-13 DIAGNOSIS — F419 Anxiety disorder, unspecified: Secondary | ICD-10-CM

## 2016-01-13 DIAGNOSIS — I6302 Cerebral infarction due to thrombosis of basilar artery: Secondary | ICD-10-CM
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
## 2016-01-13 NOTE — Telephone Encounter (Signed)
-----   Message from Pieter Partridge, DO sent at 01/13/2016  6:56 AM EST ----- Regarding: FW: neuropsych referral Julie Blair,  Can we refer Julie Blair to Dr. Antionette Poles for neuropsychological evaluation and treatment of anxiety?  Thanks. ----- Message -----    From: Delton [place], OT    Sent: 01/12/2016   4:14 PM      To: Pieter Partridge, DO Subject: neuropsych referral                            Dr. Tomi Likens,  [facility]ena Nunnery told me that you had discussed Neuropsych services with her at her last appointment.  She also reports that she would like to pursue. Could you please send a referral via epic?  Thank you,   Vianne Bulls, OTR/L 01/12/2016 4:17 PM

## 2016-01-13 NOTE — Telephone Encounter (Signed)
REferral placed to Dr. Valentina Shaggy.

## 2016-01-15 ENCOUNTER — Encounter: Payer: BC Managed Care – PPO | Admitting: Occupational Therapy

## 2016-01-15 ENCOUNTER — Ambulatory Visit: Payer: BC Managed Care – PPO | Admitting: Rehabilitation

## 2016-01-19 ENCOUNTER — Ambulatory Visit (INDEPENDENT_AMBULATORY_CARE_PROVIDER_SITE_OTHER): Payer: BC Managed Care – PPO | Admitting: Psychology

## 2016-01-19 DIAGNOSIS — F4322 Adjustment disorder with anxiety: Secondary | ICD-10-CM
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
## 2016-01-30 ENCOUNTER — Encounter: Payer: Self-pay | Admitting: Psychology

## 2016-01-30 ENCOUNTER — Ambulatory Visit: Payer: BC Managed Care – PPO | Attending: Psychology | Admitting: Psychology

## 2016-01-30 DIAGNOSIS — F4322 Adjustment disorder with anxiety: Secondary | ICD-10-CM | POA: Insufficient documentation

## 2016-01-30 NOTE — Progress Notes (Signed)
[facility]  [address]   Telephone [phone] [address] Fax [phone] [place], [place] [address]   Psychology Progress Note   Name:  Julie Blair Date of Birth:  11/27/1971 MRN:  [id number]  Date:01/30/2016 (15m) psychotherapy She reported that she did well upon returning to work part-time.   She presented as very pleasant. Affect appeared bright. Mood seemed mildly anxious. Occasional hesitant speech but predominantly fluent. Thought processes were goal directed and coherent. She took notes throughout session. No report of suicidal thoughts or risky behaviors.     Diagnostic Impression Adjustment disorder with anxious mood [F43.22]  Discussed the importance of her understanding her limiitations and not exceeding them in a desire to please others. Setting limits at work will be important for this seemingly non assertive woman. Instructed her to use brief relaxation techniques prior to dealing with busy or stimulating situations. Working towards a better work/home balance is a goal. She asked if we could discuss issues related to her marital relationship next time.  Return in two weeks,   Jamey Ripa, Ph.D Licensed Psychologist

## 2016-02-02 ENCOUNTER — Ambulatory Visit (INDEPENDENT_AMBULATORY_CARE_PROVIDER_SITE_OTHER): Payer: BC Managed Care – PPO | Admitting: Neurology

## 2016-02-02 ENCOUNTER — Encounter: Payer: Self-pay | Admitting: Neurology

## 2016-02-02 VITALS — BP 114/83 | HR 80 | Ht 63.0 in | Wt 146.2 lb

## 2016-02-02 DIAGNOSIS — I639 Cerebral infarction, unspecified: Secondary | ICD-10-CM
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
## 2016-02-02 NOTE — Patient Instructions (Signed)
I had a long discussion with the patient and her husband regarding her recent cryptogenic stroke and personally reviewed imaging studies, hospital workup and answered questions. I recommend she stay on aspirin for stroke prevention and would reduce the dose of Lipitor to 10 mg daily as her follow-up lipid profile on 01/27/15 was quite low at 31 mg percent. He also had a long discussion about patent foramen ovale and its relationship with cryptogenic stroke and at the present time recommend she stay on medical therapy. I also counseled her to increase participation in activities like meditation, yoga and regular exercise to help decrease her underlying anxiety which may be contributing to some of her symptoms. I also advised her to see ophthalmologist Dr. Frederico Hamman to discuss optical prisms for her persistent diplopia. She will return for follow-up in 6 months or call earlier if necessary

## 2016-02-06 ENCOUNTER — Other Ambulatory Visit: Payer: Self-pay

## 2016-02-06 ENCOUNTER — Telehealth: Payer: Self-pay | Admitting: Neurology

## 2016-02-06 DIAGNOSIS — I639 Cerebral infarction, unspecified: Secondary | ICD-10-CM

## 2016-02-06 MED ORDER — ATORVASTATIN CALCIUM 20 MG PO TABS: 20.0000 mg | ORAL_TABLET | Freq: Every day | ORAL | Status: DC

## 2016-02-06 MED ORDER — ATORVASTATIN CALCIUM 10 MG PO TABS
10.0000 mg | ORAL_TABLET | Freq: Every day | ORAL | Status: DC
Start: 2016-02-06 — End: 2016-11-08

## 2016-02-06 NOTE — Telephone Encounter (Signed)
Pt's husband called and wants to know if the dosage for Aspirin is too high. Does the physician think it should be lowered. Pt also needs a rx of Lipitor sent to pharm , see office notes 2/13. Please call and advise [phone] home, cell [phone] may leave message

## 2016-02-06 NOTE — Telephone Encounter (Signed)
Rn call patient back to state per Dr.Sethi he would like for her to decrease her lipitor to 10mg . Also in his last office, it was discuss decreasing to 10mg . Pt verbalized understanding, and will pick up the 20mg  of lipitor today.

## 2016-02-06 NOTE — Telephone Encounter (Signed)
Rn call patient back at about her aspirin and lipitor medication. Pt stated she has been taking 325 aspirin since her stroke November 2016. Rn stated per Dr.Sethi last note she is to continue the aspirin 325 until next visit. Refill given for Lipitor for patient,. PT verbalized understanding.

## 2016-02-09 ENCOUNTER — Encounter: Payer: Self-pay | Admitting: Cardiovascular Disease

## 2016-02-13 ENCOUNTER — Encounter: Payer: Self-pay | Admitting: Psychology

## 2016-02-13 ENCOUNTER — Ambulatory Visit (INDEPENDENT_AMBULATORY_CARE_PROVIDER_SITE_OTHER): Payer: BC Managed Care – PPO | Admitting: Psychology

## 2016-02-13 DIAGNOSIS — F4322 Adjustment disorder with anxiety: Secondary | ICD-10-CM

## 2016-02-13 NOTE — Progress Notes (Signed)
[facility]  [address]   Telephone [phone] [address] Fax [phone] [place], [place] [address]   Psychology Progress Note   Name:  Julie Blair Date of Birth:  August 08, 1971 MRN:  [id number]  Date:02/13/2016 (63m) psychotherapy [name] spoke of a many year history of marital difficulties although she has perceived her husband as less angry and more supportive towards her since her stroke. She reported that she has felt happier since her stroke in many ways, in part due to the improvement in her marital relationship and feeling less stressed by no longer working full time hours.She did not cite any difficulties related to her return work so far.  Diagnostic Impressions Adjustment disorder with anxious mood [F43.22]  Return in two weeks.  Jamey Ripa, Ph.D Licensed Psychologist

## 2016-02-20 NOTE — Patient Outreach (Signed)
[facility]) Care Management  02/20/2016  [name] October 14, 1971 [id number]   Per MD office visit note on 02/02/16 - mRS = 2   Josepha Pigg, [place] Management Assistant

## 2016-02-26 ENCOUNTER — Ambulatory Visit: Payer: BC Managed Care – PPO | Attending: Psychology | Admitting: Psychology

## 2016-02-26 DIAGNOSIS — F4322 Adjustment disorder with anxiety: Secondary | ICD-10-CM | POA: Insufficient documentation

## 2016-02-26 NOTE — Progress Notes (Signed)
[facility]  [address]   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 She has been enjoying the lower stress involved with working part-time. She has been worried that some co-workers resent her for not working long hours as dictated by the culture at work. We again discussed issues related to her marital relationship, including exploring how her interactional patterns might be maintaining the status quo. No report of major depressive-like symptoms or disabling anxiety.  Diagnostic Impressions Adjustment disorder with anxious mood [F43.22]  Return on 03/15/16.  Jamey Ripa, Ph.D Licensed Psychologist

## 2016-03-15 ENCOUNTER — Ambulatory Visit: Payer: BC Managed Care – PPO | Admitting: Psychology

## 2016-03-19 ENCOUNTER — Ambulatory Visit (INDEPENDENT_AMBULATORY_CARE_PROVIDER_SITE_OTHER): Payer: BC Managed Care – PPO | Admitting: Psychology

## 2016-03-19 ENCOUNTER — Encounter: Payer: Self-pay | Admitting: Psychology

## 2016-03-19 DIAGNOSIS — F4322 Adjustment disorder with anxiety: Secondary | ICD-10-CM | POA: Diagnosis not present

## 2016-03-19 NOTE — Progress Notes (Signed)
[facility]  [address]   Telephone [phone] [address] Fax [phone] [place], [place] [address]   Psychology Progress Note   Name:  Julie Blair Date of Birth:  [birth year] MRN:  [id number]  Date:03/19/2016 (59m) psychotherapy She continues to do well at work on a part-time schedule. She reports some persisting psychomotor slowing, occasional speech slurring when tired and brief dizziness. She has been reluctant to resume driving due to her worry about her several second long dizzy episodes. She reported that she initiated a discussion with her husband about their lack of sexual intimacy with mixed results. While he reportedly cited her "disability" as one reason to not have sex, the lack of sexual intimacy in their relationship pre-dated her stroke by several years.  Diagnostic Impression Adjustment disorder with anxious mood [F43.22]  Suggested that she follow the advice of her physicians and do some trial driving in a parking lot to get a better handle on her driving competency. Suggested she discuss her driving readiness with her neurologist, Dr. Leonie Man.  Return in one month by her request. She expressed concerns that talking about the same topics will be "boring" to me.     Jamey Ripa, Ph.D Licensed Psychologist

## 2016-04-15 ENCOUNTER — Encounter: Payer: Self-pay | Admitting: Psychology

## 2016-04-15 ENCOUNTER — Ambulatory Visit: Payer: BC Managed Care – PPO | Attending: Psychology | Admitting: Psychology

## 2016-04-15 DIAGNOSIS — F4322 Adjustment disorder with anxiety: Secondary | ICD-10-CM | POA: Diagnosis not present

## 2016-04-15 NOTE — Progress Notes (Signed)
[facility]  [address]   Telephone [phone] [address] Fax [phone] [place], [place] [address]   Psychology Progress Note   Name:  Julie Blair Date of Birth:  1971/09/22 MRN:  [id number]  Date:04/15/2016 (61m) psychotherapy She reported overall doing well. She reports improvement in her cognitive functioning, coping skills and marital relationship. Discussed aspects of dealing with work stress and learning to be more direct and confident in her interpersonal relationships. Recommended a self-help book on assertiveness.   Diagnostic Impression Adjust disorder with anxious mood [F43.22]  Agreed to discontinue treatment at this point as she has reached her goals. She knows that she is welcome to come back and see me on an as needed basis.   Jamey Ripa, Ph.D Licensed Psychologist

## 2016-07-22 ENCOUNTER — Ambulatory Visit (INDEPENDENT_AMBULATORY_CARE_PROVIDER_SITE_OTHER): Payer: BC Managed Care – PPO | Admitting: Nurse Practitioner

## 2016-07-22 ENCOUNTER — Encounter: Payer: Self-pay | Admitting: Nurse Practitioner

## 2016-07-22 VITALS — BP 107/71 | HR 62 | Ht 63.0 in | Wt 148.4 lb

## 2016-07-22 DIAGNOSIS — I639 Cerebral infarction, unspecified: Secondary | ICD-10-CM

## 2016-07-22 DIAGNOSIS — Q211 Atrial septal defect: Secondary | ICD-10-CM | POA: Diagnosis not present

## 2016-07-22 DIAGNOSIS — E785 Hyperlipidemia, unspecified: Secondary | ICD-10-CM

## 2016-07-22 DIAGNOSIS — Q2112 Patent foramen ovale: Secondary | ICD-10-CM

## 2016-07-22 DIAGNOSIS — I6302 Cerebral infarction due to thrombosis of basilar artery: Secondary | ICD-10-CM | POA: Diagnosis not present
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
## 2016-07-22 NOTE — Patient Instructions (Addendum)
Stressed the importance of management of risk factors to prevent further stroke Continue aspirin for secondary stroke prevention Maintain strict control of hypertension with blood pressure goal below 130/90, today's reading 106/71  Cholesterol with LDL cholesterol less than 70, followed by primary care,   continue statin drugs Exercise by walking, slowly increase , eat healthy diet with whole grains,  fresh fruits and vegetables Try to cut out junk food and alcohol Follow-up in 6 months

## 2016-07-27 NOTE — Progress Notes (Signed)
I agree with the above plan 

## 2016-08-03 ENCOUNTER — Ambulatory Visit: Payer: BC Managed Care – PPO | Admitting: Neurology

## 2016-08-11 ENCOUNTER — Other Ambulatory Visit: Payer: Self-pay | Admitting: Family Medicine

## 2016-08-11 DIAGNOSIS — R7989 Other specified abnormal findings of blood chemistry: Secondary | ICD-10-CM

## 2016-08-16 ENCOUNTER — Ambulatory Visit
Admission: RE | Admit: 2016-08-16 | Discharge: 2016-08-16 | Disposition: A | Discharge status: Home / Self Care | Payer: BC Managed Care – PPO | Source: Ambulatory Visit | Attending: Family Medicine | Admitting: Family Medicine

## 2016-08-16 DIAGNOSIS — R7989 Other specified abnormal findings of blood chemistry: Secondary | ICD-10-CM

## 2016-09-28 ENCOUNTER — Encounter: Payer: Self-pay | Admitting: Internal Medicine

## 2016-10-19 ENCOUNTER — Ambulatory Visit (INDEPENDENT_AMBULATORY_CARE_PROVIDER_SITE_OTHER): Payer: BC Managed Care – PPO | Admitting: Endocrinology

## 2016-10-19 ENCOUNTER — Encounter: Payer: Self-pay | Admitting: Endocrinology

## 2016-10-19 DIAGNOSIS — E039 Hypothyroidism, unspecified: Secondary | ICD-10-CM | POA: Diagnosis not present

## 2016-10-19 MED ORDER — LEVOTHYROXINE SODIUM 25 MCG PO TABS: 25.0000 ug | ORAL_TABLET | Freq: Every day | ORAL | 11 refills | Status: DC
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
## 2016-10-19 NOTE — Patient Instructions (Signed)
I have sent a prescription to your pharmacy, for a thyroid hormone pill. Please come back for a follow-up appointment in 4-6 months.  Please call sooner if the pregnancy happens.   Let's plan to recheck the ultrasound in approx 1 year.

## 2016-10-21 ENCOUNTER — Encounter: Payer: Self-pay | Admitting: Endocrinology

## 2016-10-21 DIAGNOSIS — E039 Hypothyroidism, unspecified: Secondary | ICD-10-CM | POA: Insufficient documentation

## 2016-11-08 ENCOUNTER — Other Ambulatory Visit: Payer: Self-pay | Admitting: Neurology

## 2016-11-08 DIAGNOSIS — I639 Cerebral infarction, unspecified: Secondary | ICD-10-CM

## 2016-12-03 ENCOUNTER — Encounter: Payer: Self-pay | Admitting: Cardiovascular Disease

## 2016-12-24 ENCOUNTER — Encounter: Payer: Self-pay | Admitting: Cardiovascular Disease

## 2016-12-24 ENCOUNTER — Ambulatory Visit (INDEPENDENT_AMBULATORY_CARE_PROVIDER_SITE_OTHER): Payer: BC Managed Care – PPO | Admitting: Cardiovascular Disease

## 2016-12-24 VITALS — BP 112/72 | HR 74 | Ht 63.0 in | Wt 152.6 lb

## 2016-12-24 DIAGNOSIS — Q211 Atrial septal defect: Secondary | ICD-10-CM | POA: Diagnosis not present

## 2016-12-24 DIAGNOSIS — Q2112 Patent foramen ovale: Secondary | ICD-10-CM
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
## 2016-12-24 NOTE — Patient Instructions (Signed)
Medication Instructions:  Your physician recommends that you continue on your current medications as directed. Please refer to the Current Medication list given to you today.  Labwork: No new orders.   Testing/Procedures: No new orders.   Follow-Up: Your physician recommends that you schedule a follow-up appointment as needed with  Dr Cooper.    Any Other Special Instructions Will Be Listed Below (If Applicable).     If you need a refill on your cardiac medications before your next appointment, please call your pharmacy.   

## 2017-01-24 ENCOUNTER — Ambulatory Visit (INDEPENDENT_AMBULATORY_CARE_PROVIDER_SITE_OTHER): Payer: BC Managed Care – PPO | Admitting: Nurse Practitioner

## 2017-01-24 ENCOUNTER — Encounter: Payer: Self-pay | Admitting: Nurse Practitioner

## 2017-01-24 VITALS — BP 104/76 | HR 75 | Ht 63.0 in | Wt 154.0 lb

## 2017-01-24 DIAGNOSIS — Q2112 Patent foramen ovale: Secondary | ICD-10-CM

## 2017-01-24 DIAGNOSIS — I639 Cerebral infarction, unspecified: Secondary | ICD-10-CM

## 2017-01-24 DIAGNOSIS — E785 Hyperlipidemia, unspecified: Secondary | ICD-10-CM

## 2017-01-24 DIAGNOSIS — Q211 Atrial septal defect: Secondary | ICD-10-CM

## 2017-01-24 NOTE — Patient Instructions (Addendum)
Stressed the importance of management of risk factors to prevent further stroke Continue aspirin for secondary stroke prevention Maintain strict control of hypertension with blood pressure goal below 130/90, today's reading 104/76 Stay well hydrated 8-10 glasses of water daily Cholesterol with LDL cholesterol less than 70, followed by primary care,   continue statin drugs Will check fasting lipids today Exercise by walking, slowly increase , eat healthy diet with whole grains,  fresh fruits and vegetables See ophthalmologist about prisms for double vision Will discharge  Need to establish with PCP
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
## 2017-01-25 ENCOUNTER — Telehealth: Payer: Self-pay | Admitting: *Deleted

## 2017-01-25 LAB — LIPID PANEL
CHOL/HDL RATIO: 1.8 ratio (ref 0.0–4.4)
Cholesterol, Total: 133 mg/dL (ref 100–199)
HDL: 72 mg/dL (ref >39)
LDL Calculated: 54 mg/dL (ref 0–99)
Triglycerides: 35 mg/dL (ref 0–149)
VLDL Cholesterol Cal: 7 mg/dL (ref 5–40)

## 2017-01-25 NOTE — Telephone Encounter (Signed)
-----   Message from Dennie Bible, NP sent at 01/25/2017 12:26 PM EST ----- Please let patient know. Lipid panel looks good. I would recommend she stay on the low-dose Lipitor given her history. Please send a copy of this to the patient

## 2017-01-25 NOTE — Telephone Encounter (Signed)
I relayed the results of the labs to pt.  She verbalized understanding.  She was able to pull up from [place] and see her lab results.

## 2017-03-04 IMAGING — US US THYROID
1 series · 13 of 25 positions shown · non-contrast
Comparison: None.

CLINICAL DATA: 44-year-old female with hypothyroidism

EXAM:
THYROID ULTRASOUND
TECHNIQUE: Ultrasound examination of the thyroid gland and adjacent soft
tissues was performed.

[Series 1: us thyroid · 0.06mm/px · 13 of 64 slices shown]
[im 1/64]
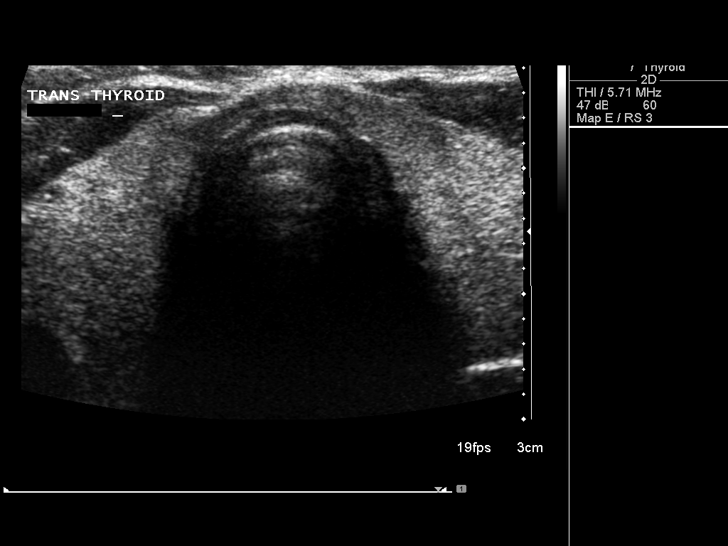
[im 6/64]
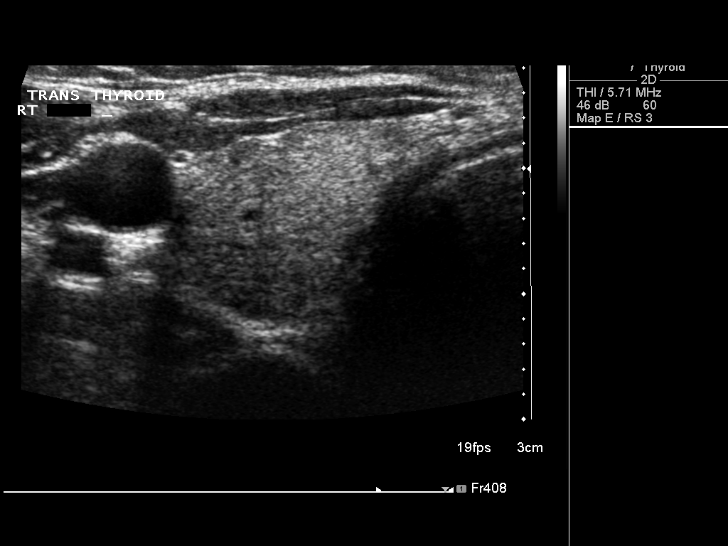
[im 11/64]
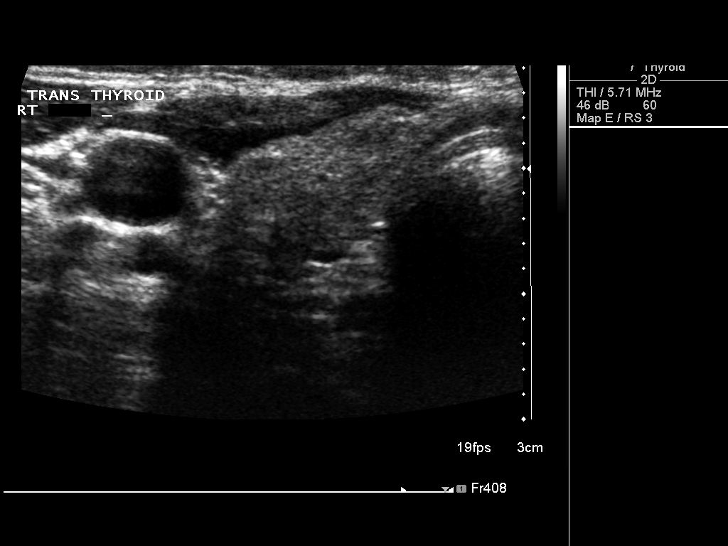
[im 16/64]
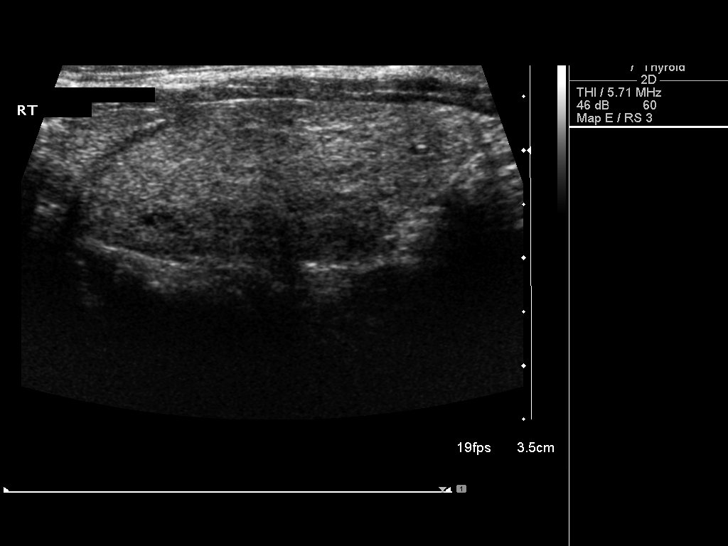
[im 22/64]
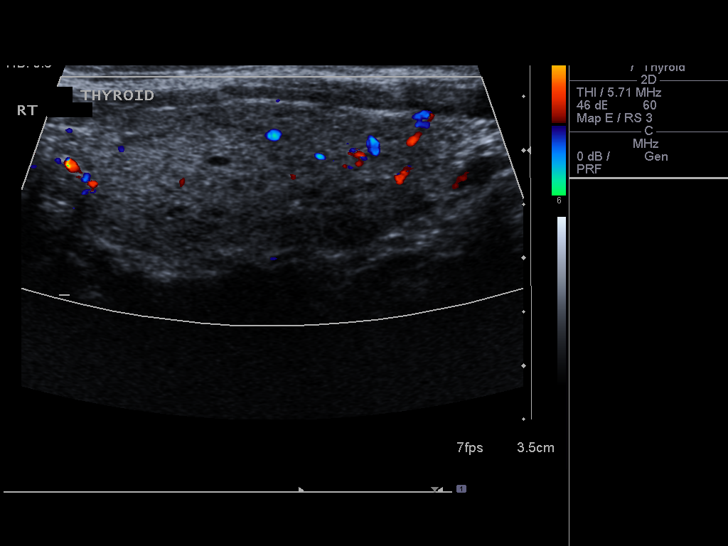
[im 27/64]
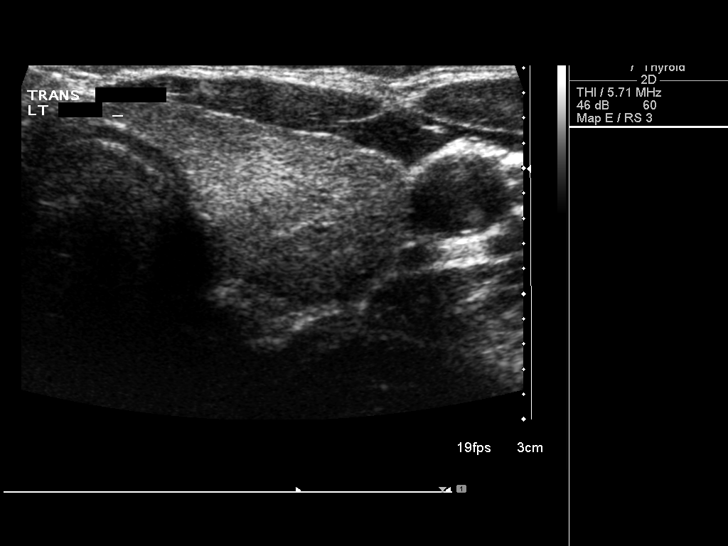
[im 32/64]
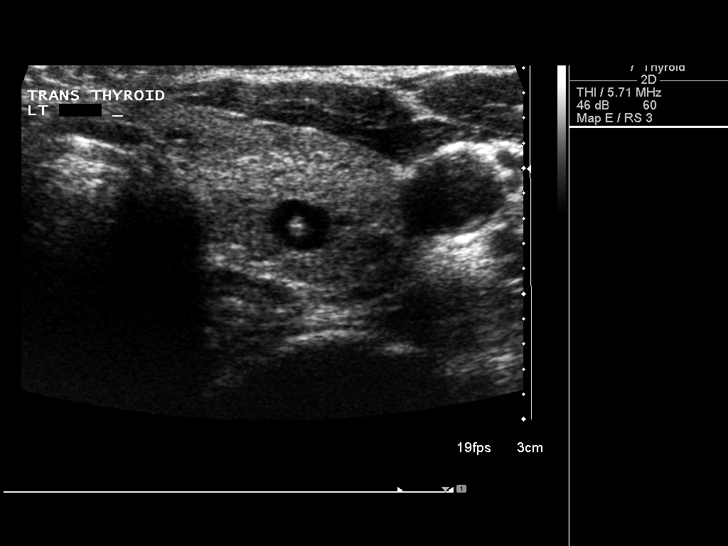
[im 37/64]
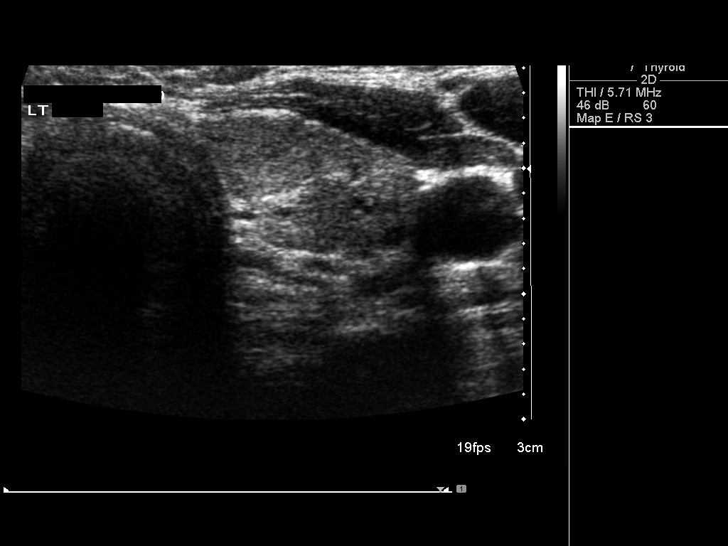
[im 43/64]
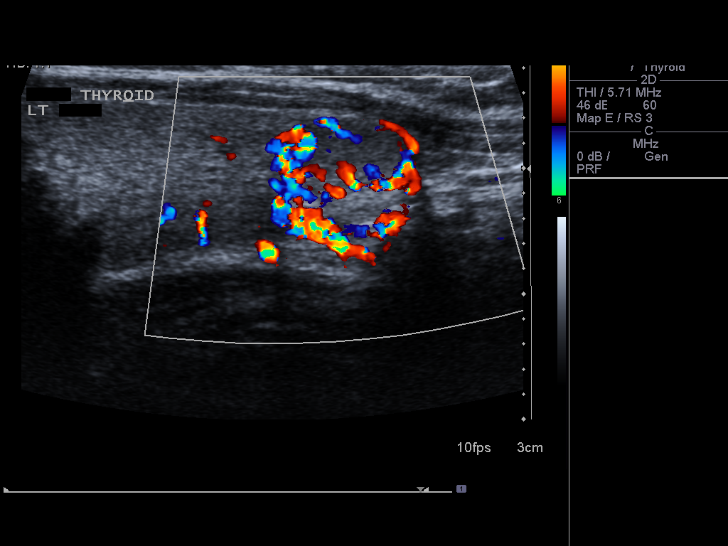
[im 48/64]
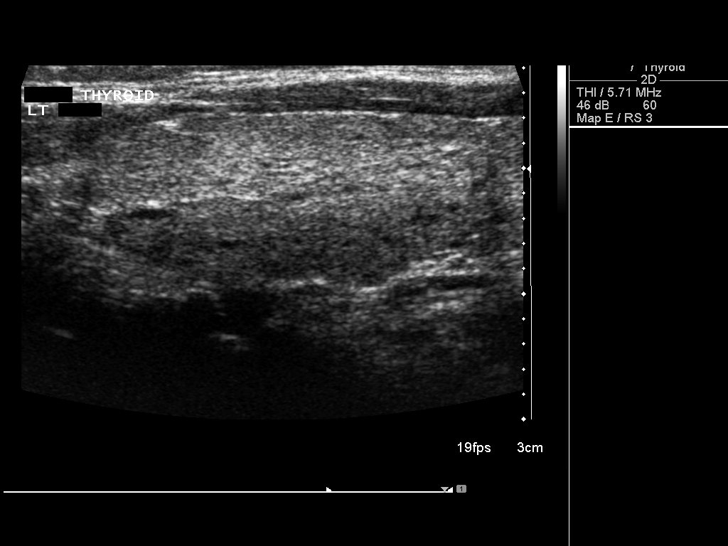
[im 53/64]
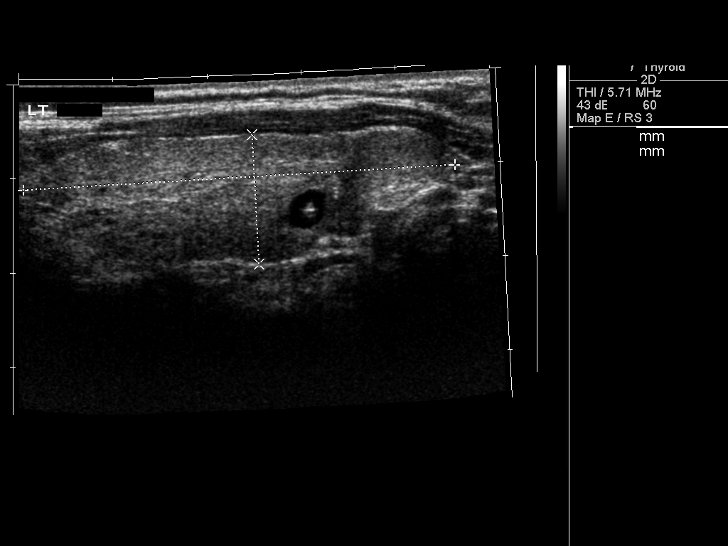
[im 58/64]
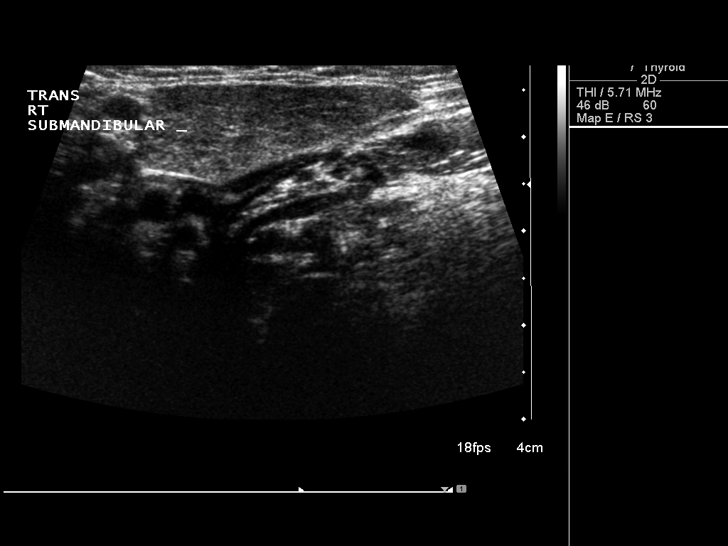
[im 64/64]
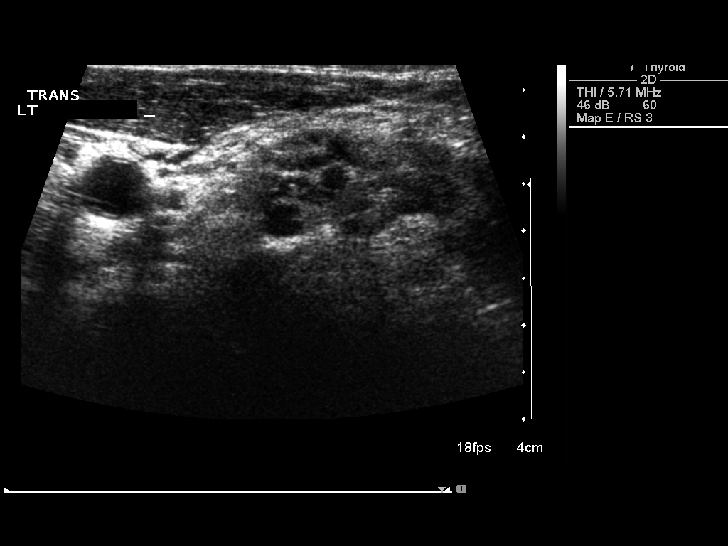

[13 of 25 positions shown; findings below may reference images not displayed]

FINDINGS: Parenchymal Echotexture: Moderately heterogenous

Estimated total number of nodules > 1 cm: <5

Number of spongiform nodules > 2 cm not described below (TR1): 0

Number of mixed cystic nodules > 1.5 cm not described below (TR2): 0

_________________________________________________________

Isthmus: 0.2 cm

No discrete nodules are identified within the thyroid isthmus.

_________________________________________________________

Right lobe: 4.1 x 1.4 x 1.8 cm

No discrete nodules are identified within the right lobe of the
thyroid.

_________________________________________________________

Left lobe: 4.6 x 1.4 x 1.8 cm

Nodule # 1:

Location: Left; Inferior

Size: 0.5 x 0.4 x 0.5 cm

Composition: cystic/almost completely cystic (0)

Echogenicity: anechoic (0)

Shape: not taller-than-wide (0)

Margins: smooth (0)

Echogenic foci: large comet-tail artifacts (0)

ACR TI-RADS total points: 0.

ACR TI-RADS risk category: TR1 (0 points).

ACR TI-RADS recommendations:

This nodule does NOT meet TI-RADS criteria for biopsy or dedicated
follow-up.

Nodule # 2:

Location: Left; Inferior

Size: 1.1 x 0.7 x 0.9 cm

Composition: solid/almost completely solid (2)

Echogenicity: isoechoic (1)

Shape: not taller-than-wide (0)

Margins: smooth (0)

Echogenic foci: none (0)

ACR TI-RADS total points: 3.

ACR TI-RADS risk category: TR3 (3 points).

ACR TI-RADS recommendations:

Given size (<1.5 cm) and appearance, this nodule does NOT meet
TI-RADS criteria for biopsy or dedicated follow-up.
IMPRESSION: There are 2 small left-sided thyroid nodules which do not meet
criteria for biopsy or dedicated follow-up imaging.

Moderately heterogeneous thyroid gland may reflect underlying
thyroiditis.

The above is in keeping with the ACR TI-RADS recommendations - [HOSPITAL] 9354;[DATE].

## 2017-04-06 ENCOUNTER — Ambulatory Visit: Payer: BC Managed Care – PPO | Admitting: Endocrinology

## 2017-04-27 ENCOUNTER — Other Ambulatory Visit: Payer: Self-pay | Admitting: Neurology

## 2017-04-27 DIAGNOSIS — I639 Cerebral infarction, unspecified: Secondary | ICD-10-CM

## 2017-05-02 ENCOUNTER — Ambulatory Visit: Payer: BC Managed Care – PPO | Admitting: Endocrinology

## 2017-05-19 ENCOUNTER — Encounter: Payer: Self-pay | Admitting: Endocrinology

## 2017-05-19 ENCOUNTER — Ambulatory Visit (INDEPENDENT_AMBULATORY_CARE_PROVIDER_SITE_OTHER): Payer: BC Managed Care – PPO | Admitting: Endocrinology

## 2017-05-19 VITALS — BP 98/64 | HR 71 | Ht 63.0 in | Wt 154.0 lb

## 2017-05-19 DIAGNOSIS — E039 Hypothyroidism, unspecified: Secondary | ICD-10-CM | POA: Diagnosis not present

## 2017-05-19 DIAGNOSIS — E042 Nontoxic multinodular goiter: Secondary | ICD-10-CM | POA: Insufficient documentation

## 2017-05-19 LAB — T4, FREE: FREE T4: 0.99 ng/dL (ref 0.60–1.60)

## 2017-05-19 LAB — TSH: TSH: 1.3 u[IU]/mL (ref 0.35–4.50)

## 2017-05-19 NOTE — Patient Instructions (Addendum)
Please come back for a follow-up appointment in 4-6 months.  Please call sooner if the pregnancy happens.   Let's recheck the ultrasound in a few months.  you will receive a phone call, about a day and time for an appointment
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
## 2017-08-16 ENCOUNTER — Other Ambulatory Visit: Payer: Self-pay | Admitting: Endocrinology

## 2017-10-25 ENCOUNTER — Other Ambulatory Visit: Payer: Self-pay | Admitting: Neurology

## 2017-10-25 DIAGNOSIS — I639 Cerebral infarction, unspecified: Secondary | ICD-10-CM

## 2017-10-28 ENCOUNTER — Ambulatory Visit
Admission: RE | Admit: 2017-10-28 | Discharge: 2017-10-28 | Disposition: A | Discharge status: Home / Self Care | Payer: BC Managed Care – PPO | Source: Ambulatory Visit | Attending: Endocrinology | Admitting: Endocrinology

## 2017-10-28 DIAGNOSIS — E042 Nontoxic multinodular goiter: Secondary | ICD-10-CM

## 2017-11-17 ENCOUNTER — Ambulatory Visit: Payer: BC Managed Care – PPO | Admitting: Endocrinology

## 2017-11-23 ENCOUNTER — Encounter: Payer: Self-pay | Admitting: Endocrinology

## 2017-11-23 ENCOUNTER — Ambulatory Visit: Payer: BC Managed Care – PPO | Admitting: Endocrinology

## 2017-11-23 VITALS — BP 92/60 | HR 84 | Wt 151.8 lb

## 2017-11-23 DIAGNOSIS — E039 Hypothyroidism, unspecified: Secondary | ICD-10-CM | POA: Diagnosis not present

## 2017-11-23 LAB — TSH: TSH: 2.3 u[IU]/mL (ref 0.35–4.50)

## 2017-11-23 NOTE — Patient Instructions (Addendum)
A thyroid blood test is requested for you today.  We'll let you know about the results.   I would be happy to see you back here as needed.    
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
## 2017-11-30 ENCOUNTER — Other Ambulatory Visit: Payer: Self-pay | Admitting: Endocrinology

## 2017-12-09 ENCOUNTER — Other Ambulatory Visit: Payer: Self-pay

## 2017-12-09 MED ORDER — LEVOTHYROXINE SODIUM 25 MCG PO TABS: 25.0000 ug | ORAL_TABLET | Freq: Every day | ORAL | 1 refills | Status: DC

## 2018-08-24 ENCOUNTER — Other Ambulatory Visit: Payer: Self-pay | Admitting: Endocrinology

## 2019-02-18 ENCOUNTER — Other Ambulatory Visit: Payer: Self-pay | Admitting: Endocrinology

## 2019-02-18 NOTE — Telephone Encounter (Signed)
Options: Please refill x 1, and ov is due Please forward refill request to pt's primary care provider.

## 2019-10-03 IMAGING — US US THYROID
1 series · 13 of 25 positions shown · non-contrast
Comparison: 08/16/2016

CLINICAL DATA: Multinodular goiter  .  Elevated TSH.

EXAM:
THYROID ULTRASOUND
TECHNIQUE: Ultrasound examination of the thyroid gland and adjacent soft
tissues was performed.

[Series 1: us thyroid · 0.05mm/px · 13 of 45 slices shown]
[im 1/45]
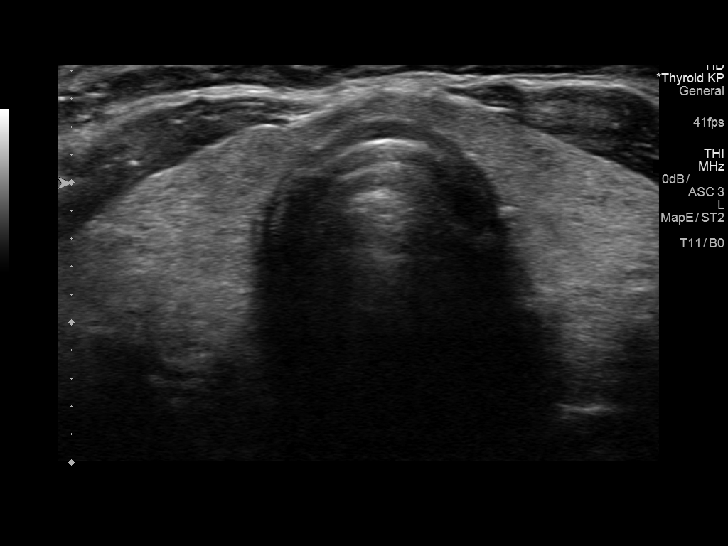
[im 4/45]
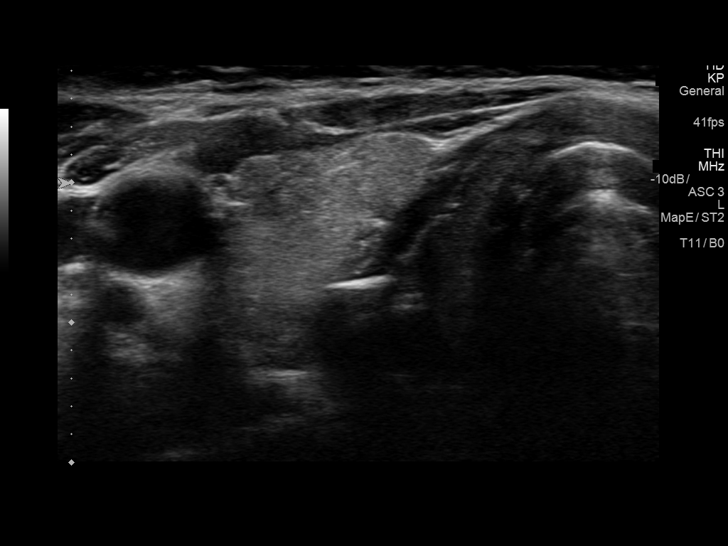
[im 8/45]
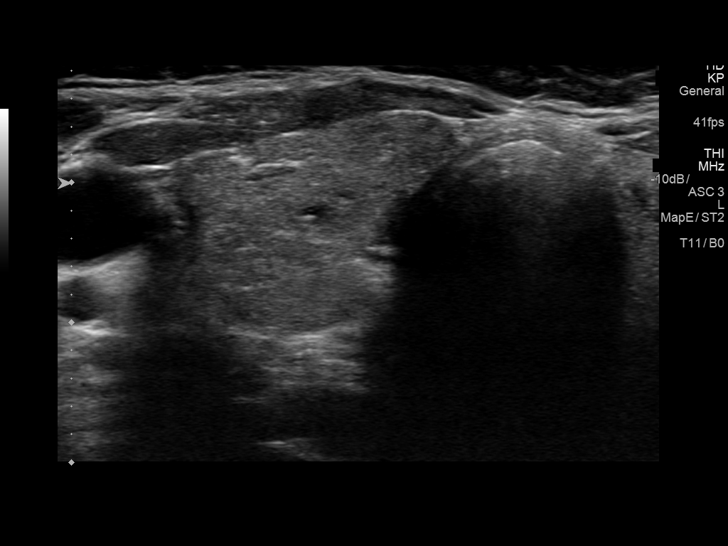
[im 12/45]
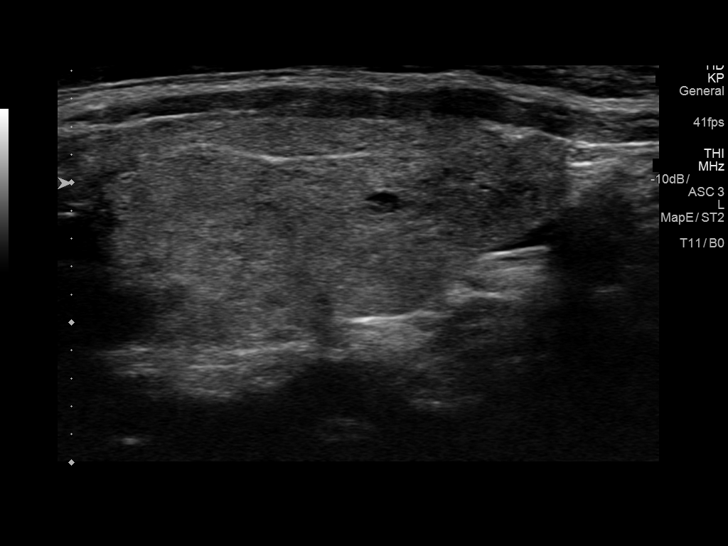
[im 15/45]
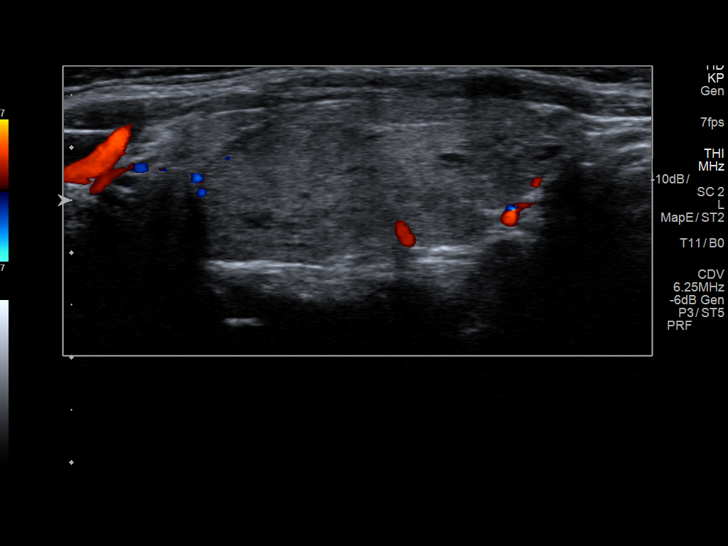
[im 19/45]
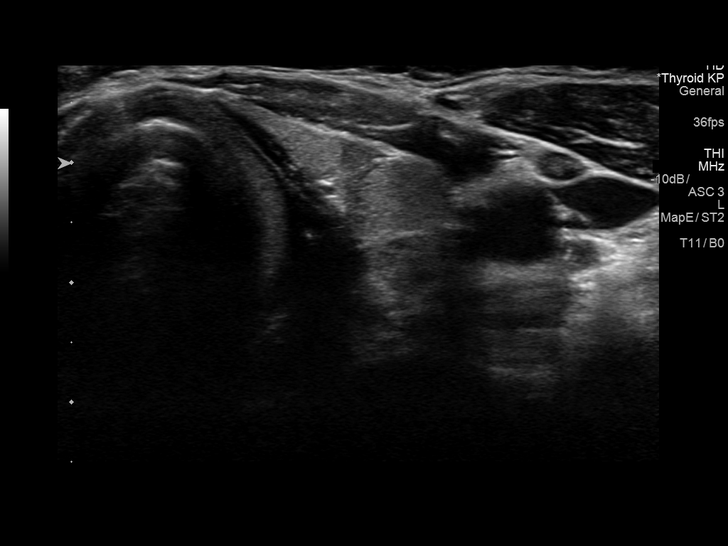
[im 23/45]
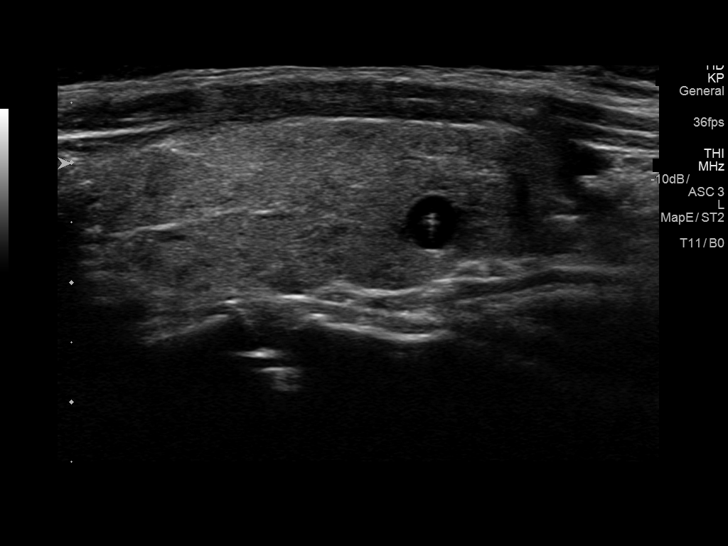
[im 26/45]
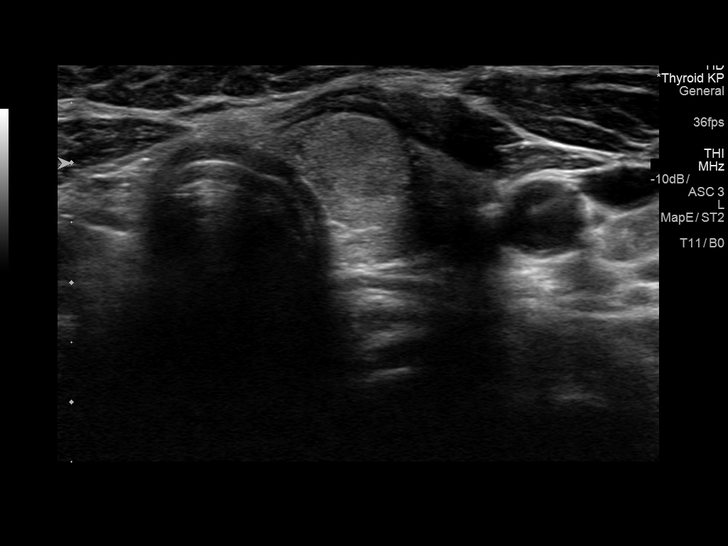
[im 30/45]
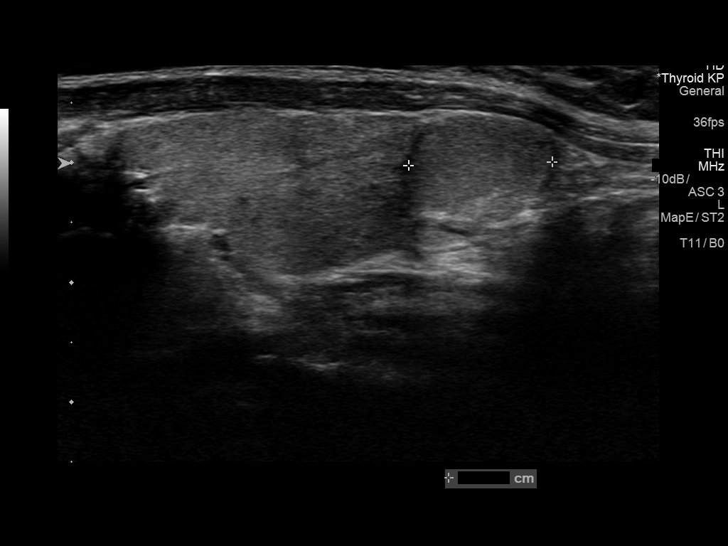
[im 34/45]
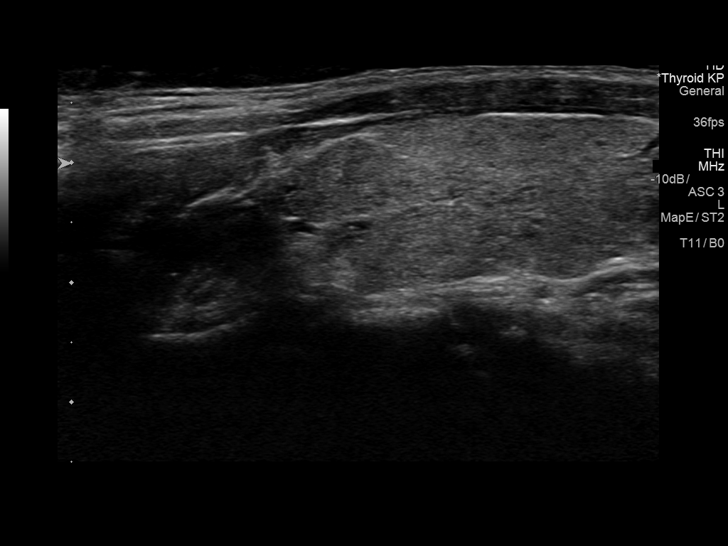
[im 37/45]
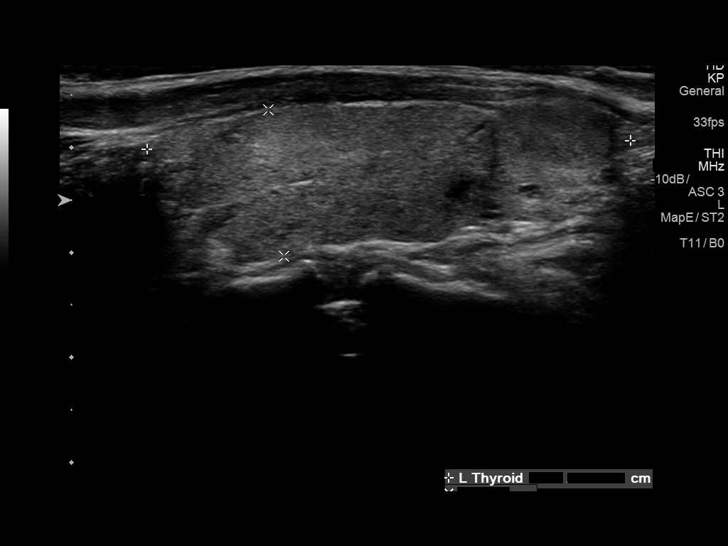
[im 41/45]
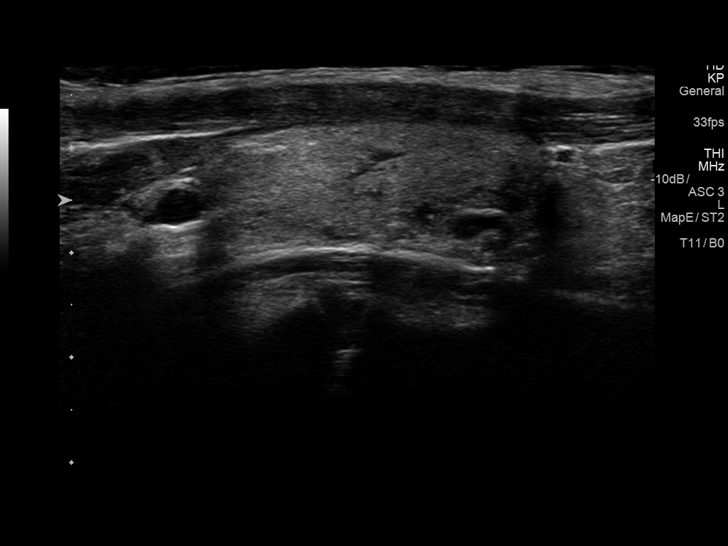
[im 45/45]
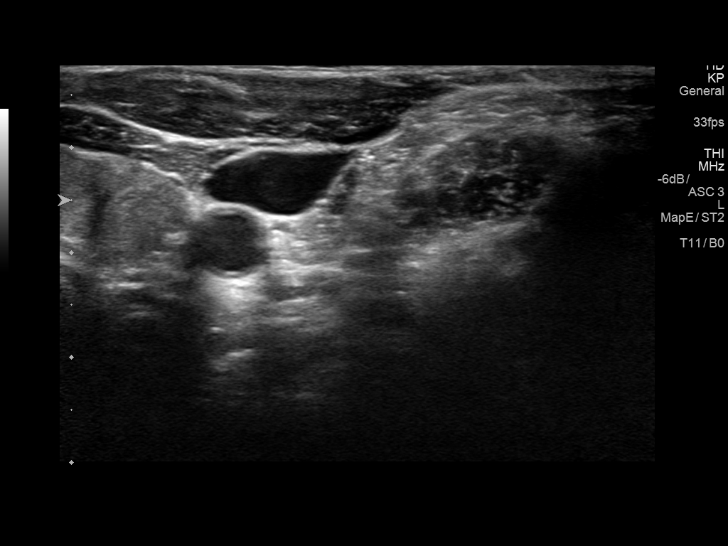

[13 of 25 positions shown; findings below may reference images not displayed]

FINDINGS: Parenchymal Echotexture: Mildly heterogenous

Isthmus: 0.2 cm thickness, stable

Right lobe: 4.1 x 1.5 x 1.8 cm, previously 4.1 x 1.4 x

Left lobe: 4.6 x 9.0x9.4 cm, previously 4.6 x 1.4 x

_________________________________________________________

Estimated total number of nodules >/= 1 cm: 1

Number of spongiform nodules >/=  2 cm not described below (TR1): 0

Number of mixed cystic and solid nodules >/= 1.5 cm not described
below (TR2): 0

_________________________________________________________

Nodule # 1:

Prior biopsy: No

Location: Left; Inferior

Maximum size: 1.2 cm; Other 2 dimensions: 0.7 x 0.9 cm, previously,
1.1 x 0.7 x 0.9 cm

Composition: solid/almost completely solid (2)

Echogenicity: isoechoic (1)

Shape: not taller-than-wide (0)

Margins: smooth (0)

Echogenic foci: none (0)

ACR TI-RADS total points: 3.

ACR TI-RADS risk category:  TR3 (3 points).

Significant change in size (>/= 20% in two dimensions and minimal
increase of 2 mm): No

Change in features: No

Change in ACR TI-RADS risk category: No

ACR TI-RADS recommendations:

Given size (<1.4 cm) and appearance, this nodule does NOT meet
TI-RADS criteria for biopsy or dedicated follow-up.

_________________________________________________________

0.5 cm colloid cyst, inferior left lobe
IMPRESSION: 1. Normal-sized thyroid with stable small left cyst and nodule.
Neither meets criteria for biopsy or dedicated imaging follow-up.

The above is in keeping with the ACR TI-RADS recommendations - [HOSPITAL] 3002;[DATE].

## 2019-10-26 ENCOUNTER — Other Ambulatory Visit: Payer: Self-pay | Admitting: Urology

## 2019-11-20 ENCOUNTER — Other Ambulatory Visit: Payer: Self-pay

## 2019-11-20 ENCOUNTER — Other Ambulatory Visit (HOSPITAL_COMMUNITY)
Admission: RE | Admit: 2019-11-20 | Discharge: 2019-11-20 | Disposition: A | Discharge status: Home / Self Care | Payer: BC Managed Care – PPO | Source: Ambulatory Visit | Attending: Urology | Admitting: Urology

## 2019-11-20 ENCOUNTER — Encounter (HOSPITAL_BASED_OUTPATIENT_CLINIC_OR_DEPARTMENT_OTHER): Payer: Self-pay | Admitting: *Deleted

## 2019-11-20 DIAGNOSIS — Z20828 Contact with and (suspected) exposure to other viral communicable diseases: Secondary | ICD-10-CM | POA: Insufficient documentation

## 2019-11-20 DIAGNOSIS — Z01812 Encounter for preprocedural laboratory examination: Secondary | ICD-10-CM | POA: Insufficient documentation

## 2019-11-20 NOTE — Progress Notes (Addendum)
Spoke w/ via phone for pre-op interview---[facility] needs dos----I stat 8, ekg, urine poct              Lab results------see anesthsia review below COVID test ------11-21-2019 Arrive at -------530 11-23-2019 NPO after ------midnight Medications to take morning of surgery ----- Diabetic medication -----levothyroxine, es tylenol prn Patient Special Instructions ----- Pre-Op special Istructions ----- Patient verbalized understanding of instructions that were given at this phone interview. Patient denies shortness of breath, chest pain, fever, cough a this phone interview.  Anesthesia Review:secure chat sent to Janett Billow zanett, patient ok  For [facility] per Janett Billow zanetto pa  PCP:dr murrow eagle at [facility] Cardiologist :saw dr Legrand [place] cooper 12-24-2016 note on chart/epic, released from dr cooper Chest x-ray :none EKG :none Echo :echo and tee 12-24-2016 charte/pic, echo11-28-16 chart/epic Cardiac Cath : none NEUROLOGY LOV 2-5 2018 chart/epic, released from neurology Sleep Study/ CPAP :none Fasting Blood Sugar :      / Checks Blood Sugar -- times a day:  n/a Blood Thinner/ Instructions /Last Dose:n/a ASA / Instructions/ Last Dose : staying on 325 aspiring per dr Tresa Moore  Patient denies shortness of breath, chest pain, fever, and cough at this phone interview.

## 2019-11-22 LAB — NOVEL CORONAVIRUS, NAA (HOSP ORDER, SEND-OUT TO REF LAB; TAT 18-24 HRS): SARS-CoV-2, NAA: NOT DETECTED

## 2019-11-23 ENCOUNTER — Ambulatory Visit (HOSPITAL_BASED_OUTPATIENT_CLINIC_OR_DEPARTMENT_OTHER)
Admission: RE | Admit: 2019-11-23 | Discharge: 2019-11-23 | Disposition: A | Discharge status: Home / Self Care | Payer: BC Managed Care – PPO | Attending: Urology | Admitting: Urology

## 2019-11-23 ENCOUNTER — Other Ambulatory Visit: Payer: Self-pay

## 2019-11-23 ENCOUNTER — Ambulatory Visit (HOSPITAL_BASED_OUTPATIENT_CLINIC_OR_DEPARTMENT_OTHER): Payer: BC Managed Care – PPO | Admitting: Physician Assistant

## 2019-11-23 ENCOUNTER — Encounter (HOSPITAL_BASED_OUTPATIENT_CLINIC_OR_DEPARTMENT_OTHER)
Admission: RE | Disposition: A | Discharge status: Home / Self Care | Payer: Self-pay | Source: Home / Self Care | Attending: Urology

## 2019-11-23 ENCOUNTER — Encounter (HOSPITAL_BASED_OUTPATIENT_CLINIC_OR_DEPARTMENT_OTHER): Payer: Self-pay

## 2019-11-23 DIAGNOSIS — N202 Calculus of kidney with calculus of ureter: Secondary | ICD-10-CM | POA: Diagnosis not present

## 2019-11-23 DIAGNOSIS — E785 Hyperlipidemia, unspecified: Secondary | ICD-10-CM | POA: Diagnosis not present

## 2019-11-23 DIAGNOSIS — E039 Hypothyroidism, unspecified: Secondary | ICD-10-CM | POA: Insufficient documentation

## 2019-11-23 DIAGNOSIS — H509 Unspecified strabismus: Secondary | ICD-10-CM | POA: Diagnosis not present

## 2019-11-23 DIAGNOSIS — Z87891 Personal history of nicotine dependence: Secondary | ICD-10-CM | POA: Diagnosis not present

## 2019-11-23 DIAGNOSIS — Q211 Atrial septal defect: Secondary | ICD-10-CM | POA: Insufficient documentation

## 2019-11-23 DIAGNOSIS — Z7982 Long term (current) use of aspirin: Secondary | ICD-10-CM | POA: Insufficient documentation

## 2019-11-23 DIAGNOSIS — R278 Other lack of coordination: Secondary | ICD-10-CM | POA: Insufficient documentation

## 2019-11-23 DIAGNOSIS — I69398 Other sequelae of cerebral infarction: Secondary | ICD-10-CM | POA: Insufficient documentation

## 2019-11-23 DIAGNOSIS — N2889 Other specified disorders of kidney and ureter: Secondary | ICD-10-CM | POA: Diagnosis not present

## 2019-11-23 DIAGNOSIS — Z79899 Other long term (current) drug therapy: Secondary | ICD-10-CM | POA: Insufficient documentation

## 2019-11-23 DIAGNOSIS — Z79890 Hormone replacement therapy (postmenopausal): Secondary | ICD-10-CM | POA: Insufficient documentation

## 2019-11-23 HISTORY — DX: Pain in unspecified hip: M25.559

## 2019-11-23 HISTORY — DX: Calculus of kidney: N20.0

## 2019-11-23 HISTORY — PX: CYSTOSCOPY WITH RETROGRADE PYELOGRAM, URETEROSCOPY AND STENT PLACEMENT: SHX5789

## 2019-11-23 HISTORY — DX: Patent foramen ovale: Q21.12

## 2019-11-23 HISTORY — PX: HOLMIUM LASER APPLICATION: SHX5852

## 2019-11-23 HISTORY — DX: Atrial septal defect: Q21.1

## 2019-11-23 LAB — POCT I-STAT, CHEM 8
BUN: 8 mg/dL (ref 6–20)
Calcium, Ion: 1.25 mmol/L (ref 1.15–1.40)
Chloride: 106 mmol/L (ref 98–111)
Creatinine, Ser: 0.7 mg/dL (ref 0.44–1.00)
Glucose, Bld: 99 mg/dL (ref 70–99)
HCT: 37 % (ref 36.0–46.0)
Hemoglobin: 12.6 g/dL (ref 12.0–15.0)
Potassium: 4.2 mmol/L (ref 3.5–5.1)
Sodium: 139 mmol/L (ref 135–145)
TCO2: 23 mmol/L (ref 22–32)

## 2019-11-23 LAB — POCT PREGNANCY, URINE: Preg Test, Ur: NEGATIVE

## 2019-11-23 SURGERY — CYSTOURETEROSCOPY, WITH RETROGRADE PYELOGRAM AND STENT INSERTION
Anesthesia: General | Site: Pelvis | Laterality: Right

## 2019-11-23 MED ORDER — SODIUM CHLORIDE 0.9 % IR SOLN
Status: DC | PRN
  Administered 2019-11-23: 3000 mL via INTRAVESICAL

## 2019-11-23 MED ORDER — DEXAMETHASONE SODIUM PHOSPHATE 10 MG/ML IJ SOLN
INTRAMUSCULAR | Status: DC | PRN
  Administered 2019-11-23: 5 mg via INTRAVENOUS

## 2019-11-23 MED ORDER — PROPOFOL 10 MG/ML IV BOLUS
INTRAVENOUS | Status: DC | PRN
  Administered 2019-11-23: 200 mg via INTRAVENOUS
  Administered 2019-11-23: 50 mg via INTRAVENOUS

## 2019-11-23 MED ORDER — MEPERIDINE HCL 25 MG/ML IJ SOLN
6.2500 mg | INTRAMUSCULAR | Status: DC | PRN
  Filled 2019-11-23: qty 1

## 2019-11-23 MED ORDER — MIDAZOLAM HCL 2 MG/2ML IJ SOLN
INTRAMUSCULAR | Status: AC
  Filled 2019-11-23: qty 2

## 2019-11-23 MED ORDER — KETOROLAC TROMETHAMINE 30 MG/ML IJ SOLN
INTRAMUSCULAR | Status: AC
  Filled 2019-11-23: qty 1

## 2019-11-23 MED ORDER — PHENYLEPHRINE 40 MCG/ML (10ML) SYRINGE FOR IV PUSH (FOR BLOOD PRESSURE SUPPORT)
PREFILLED_SYRINGE | INTRAVENOUS | Status: AC
  Filled 2019-11-23: qty 10

## 2019-11-23 MED ORDER — OXYCODONE HCL 5 MG PO TABS
5.0000 mg | ORAL_TABLET | Freq: Once | ORAL | Status: DC | PRN
  Filled 2019-11-23: qty 1

## 2019-11-23 MED ORDER — PROPOFOL 10 MG/ML IV BOLUS
INTRAVENOUS | Status: AC
  Filled 2019-11-23: qty 20

## 2019-11-23 MED ORDER — SCOPOLAMINE 1 MG/3DAYS TD PT72
1.0000 | MEDICATED_PATCH | TRANSDERMAL | Status: DC
  Administered 2019-11-23: 1.5 mg via TRANSDERMAL
  Filled 2019-11-23: qty 1

## 2019-11-23 MED ORDER — PHENYLEPHRINE 40 MCG/ML (10ML) SYRINGE FOR IV PUSH (FOR BLOOD PRESSURE SUPPORT)
PREFILLED_SYRINGE | INTRAVENOUS | Status: DC | PRN
  Administered 2019-11-23: 40 ug via INTRAVENOUS
  Administered 2019-11-23: 80 ug via INTRAVENOUS

## 2019-11-23 MED ORDER — LIDOCAINE 2% (20 MG/ML) 5 ML SYRINGE
INTRAMUSCULAR | Status: AC
  Filled 2019-11-23: qty 5

## 2019-11-23 MED ORDER — ONDANSETRON HCL 4 MG/2ML IJ SOLN
INTRAMUSCULAR | Status: DC | PRN
  Administered 2019-11-23: 4 mg via INTRAVENOUS

## 2019-11-23 MED ORDER — GENTAMICIN SULFATE 40 MG/ML IJ SOLN
330.0000 mg | INTRAVENOUS | Status: AC
  Administered 2019-11-23: 07:00:00 330 mg via INTRAVENOUS
  Filled 2019-11-23: qty 8.25

## 2019-11-23 MED ORDER — DEXAMETHASONE SODIUM PHOSPHATE 10 MG/ML IJ SOLN
INTRAMUSCULAR | Status: AC
  Filled 2019-11-23: qty 1

## 2019-11-23 MED ORDER — FENTANYL CITRATE (PF) 100 MCG/2ML IJ SOLN
INTRAMUSCULAR | Status: AC
  Filled 2019-11-23: qty 2

## 2019-11-23 MED ORDER — OXYCODONE HCL 5 MG/5ML PO SOLN
5.0000 mg | Freq: Once | ORAL | Status: DC | PRN
  Filled 2019-11-23: qty 5

## 2019-11-23 MED ORDER — LIDOCAINE HCL (PF) 2 % IJ SOLN
INTRAMUSCULAR | Status: DC | PRN
  Administered 2019-11-23: 60 mg via INTRADERMAL

## 2019-11-23 MED ORDER — HYDROMORPHONE HCL 1 MG/ML IJ SOLN
0.2500 mg | INTRAMUSCULAR | Status: DC | PRN
  Filled 2019-11-23: qty 0.5

## 2019-11-23 MED ORDER — SCOPOLAMINE 1 MG/3DAYS TD PT72
MEDICATED_PATCH | TRANSDERMAL | Status: AC
  Filled 2019-11-23: qty 1

## 2019-11-23 MED ORDER — PROMETHAZINE HCL 25 MG/ML IJ SOLN
6.2500 mg | INTRAMUSCULAR | Status: DC | PRN
  Filled 2019-11-23: qty 1

## 2019-11-23 MED ORDER — OXYCODONE-ACETAMINOPHEN 5-325 MG PO TABS: 1.0000 | ORAL_TABLET | Freq: Three times a day (TID) | ORAL | 0 refills | Status: AC | PRN

## 2019-11-23 MED ORDER — LACTATED RINGERS IV SOLN
INTRAVENOUS | Status: DC
  Administered 2019-11-23: 50 mL/h via INTRAVENOUS
  Filled 2019-11-23: qty 1000

## 2019-11-23 MED ORDER — WHITE PETROLATUM EX OINT
TOPICAL_OINTMENT | CUTANEOUS | Status: AC
  Filled 2019-11-23: qty 5

## 2019-11-23 MED ORDER — SENNOSIDES-DOCUSATE SODIUM 8.6-50 MG PO TABS: 1.0000 | ORAL_TABLET | Freq: Two times a day (BID) | ORAL | 0 refills | Status: AC

## 2019-11-23 MED ORDER — MIDAZOLAM HCL 2 MG/2ML IJ SOLN
INTRAMUSCULAR | Status: DC | PRN
  Administered 2019-11-23: 2 mg via INTRAVENOUS

## 2019-11-23 MED ORDER — FENTANYL CITRATE (PF) 100 MCG/2ML IJ SOLN
INTRAMUSCULAR | Status: DC | PRN
  Administered 2019-11-23: 50 ug via INTRAVENOUS

## 2019-11-23 MED ORDER — CEPHALEXIN 500 MG PO CAPS: 500.0000 mg | ORAL_CAPSULE | Freq: Two times a day (BID) | ORAL | 0 refills | Status: AC

## 2019-11-23 MED ORDER — KETOROLAC TROMETHAMINE 10 MG PO TABS: 10.0000 mg | ORAL_TABLET | Freq: Three times a day (TID) | ORAL | 0 refills | Status: AC | PRN

## 2019-11-23 MED ORDER — ACETAMINOPHEN 500 MG PO TABS
1000.0000 mg | ORAL_TABLET | Freq: Once | ORAL | Status: AC
  Administered 2019-11-23: 1000 mg via ORAL
  Filled 2019-11-23: qty 2

## 2019-11-23 MED ORDER — IOHEXOL 300 MG/ML  SOLN
INTRAMUSCULAR | Status: DC | PRN
  Administered 2019-11-23: 15 mL via URETHRAL

## 2019-11-23 MED ORDER — ONDANSETRON HCL 4 MG/2ML IJ SOLN
INTRAMUSCULAR | Status: AC
  Filled 2019-11-23: qty 2

## 2019-11-23 MED ORDER — KETOROLAC TROMETHAMINE 30 MG/ML IJ SOLN
30.0000 mg | Freq: Once | INTRAMUSCULAR | Status: DC | PRN
  Filled 2019-11-23: qty 1

## 2019-11-23 MED ORDER — ACETAMINOPHEN 500 MG PO TABS
ORAL_TABLET | ORAL | Status: AC
  Filled 2019-11-23: qty 2

## 2019-11-23 MED ORDER — KETOROLAC TROMETHAMINE 30 MG/ML IJ SOLN
INTRAMUSCULAR | Status: DC | PRN
  Administered 2019-11-23: 30 mg via INTRAVENOUS

## 2019-11-23 SURGICAL SUPPLY — 25 items
BAG DRAIN URO-CYSTO SKYTR STRL (DRAIN) ×2 IMPLANT
BAG DRN UROCATH (DRAIN) ×1
BASKET LASER NITINOL 1.9FR (BASKET) ×1 IMPLANT
BSKT STON RTRVL 120 1.9FR (BASKET) ×1
CATH INTERMIT  6FR 70CM (CATHETERS) ×1 IMPLANT
CLOTH BEACON ORANGE TIMEOUT ST (SAFETY) ×1 IMPLANT
FIBER LASER FLEXIVA 365 (UROLOGICAL SUPPLIES) IMPLANT
FIBER LASER TRAC TIP (UROLOGICAL SUPPLIES) ×1 IMPLANT
GLOVE BIO SURGEON STRL SZ7.5 (GLOVE) ×2 IMPLANT
GOWN STRL REUS W/TWL LRG LVL3 (GOWN DISPOSABLE) ×2 IMPLANT
GUIDEWIRE ANG ZIPWIRE 038X150 (WIRE) ×2 IMPLANT
GUIDEWIRE STR DUAL SENSOR (WIRE) ×2 IMPLANT
IV NS 1000ML (IV SOLUTION)
IV NS 1000ML BAXH (IV SOLUTION) ×1 IMPLANT
IV NS IRRIG 3000ML ARTHROMATIC (IV SOLUTION) ×2 IMPLANT
KIT TURNOVER CYSTO (KITS) ×2 IMPLANT
MANIFOLD NEPTUNE II (INSTRUMENTS) ×2 IMPLANT
NS IRRIG 500ML POUR BTL (IV SOLUTION) ×2 IMPLANT
PACK CYSTO (CUSTOM PROCEDURE TRAY) ×2 IMPLANT
SHEATH URETERAL 12FRX28CM (UROLOGICAL SUPPLIES) ×1 IMPLANT
STENT POLARIS 5FRX22 (STENTS) ×1 IMPLANT
SYR 10ML LL (SYRINGE) ×2 IMPLANT
TUBE CONNECTING 12X1/4 (SUCTIONS) ×2 IMPLANT
TUBE FEEDING 8FR 16IN STR KANG (MISCELLANEOUS) IMPLANT
TUBING UROLOGY SET (TUBING) ×2 IMPLANT

## 2019-11-23 NOTE — Transfer of Care (Signed)
Immediate Anesthesia Transfer of Care Note  Patient: Julie Blair  Procedure(s) Performed: Procedure(s) (LRB): CYSTOSCOPY WITH RETROGRADE PYELOGRAM, URETEROSCOPY AND STENT PLACEMENT (Right) HOLMIUM LASER APPLICATION (Right)  Patient Location: PACU  Anesthesia Type: General  Level of Consciousness: awake, oriented, sedated and patient cooperative  Airway & Oxygen Therapy: Patient Spontanous Breathing and Patient connected to face mask oxygen  Post-op Assessment: Report given to PACU RN and Post -op Vital signs reviewed and stable  Post vital signs: Reviewed and stable  Complications: No apparent anesthesia complications Last Vitals:  Vitals Value Taken Time  BP 118/79 11/23/19 0838  Temp    Pulse 63 11/23/19 0839  Resp 16 11/23/19 0839  SpO2 99 % 11/23/19 0839  Vitals shown include unvalidated device data.  Last Pain:  Vitals:   11/23/19 [id number]  TempSrc: Oral  PainSc: 2       Patients Stated Pain Goal: 6 (11/23/19 [id number])

## 2019-11-23 NOTE — Op Note (Signed)
NAME: Julie Blair, ZHAO MEDICAL RECORD [id number] ACCOUNT [id number] DATE OF BIRTH:28-Jul-1971 FACILITY: [facility] LOCATION: [facility] PHYSICIAN:Nickolaus Bordelon, MD  OPERATIVE REPORT  DATE OF PROCEDURE:  11/23/2019  PREOPERATIVE DIAGNOSIS:  Right distal ureteral stone, small volume right renal stone, right ureterocele.  POSTOPERATIVE DIAGNOSIS:  Right distal ureteral stone, small volume right renal stone, right ureterocele.  PROCEDURE: 1.  Cystoscopy, right retrograde pyelogram, interpretation. 2.  Right ureteroscopy with laser lithotripsy. 3.  Insertion of right ureteral stent 5 x 22 Polaris, no tether.  FINDINGS: 1.  Right ureterovesical junction stone and wide mouth ureterocele. 2.  Small volume right lower pole stone. 3.  Complete resolution of all accessible stone fragments larger than one-third mm following laser lithotripsy and basket extraction. 4.  Successful placement of right ureteral stent proximal renal pelvis, distal end in urinary bladder.  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  She was found on workup of increasing lower urinary tract symptoms to have a substantial  volume right distal ureteral stone approximately 1.5-2 cm.  There is no hydronephrosis.  Orientation was concerning for likely a ureterocele.  Options were discussed including recommended path of endoscopic management with ureteroscopy, possible  ureterocele incision and she wished to proceed.  Informed consent was then placed and obtained in medical record.  DESCRIPTION OF PROCEDURE:  The patient was identified.  Procedure being right ureteroscopic stimulation was confirmed.  Procedure timeout was performed.  Intravenous antibiotics administered.  General anesthesia induced.  The patient was placed into a  low lithotomy position.  Sterile field was created prepped and draped the patient's  vagina, introitus and proximal thighs using iodine.  Cystourethroscopy was performed with a 21-French rigid cystoscope with offset lens.  Inspection of bladder revealed  no diverticula, calcifications, papillary lesions.  There was some outpouching of the right intramural ureter without evidence of visible stone.  This was consistent with ureterocele.  The orifice was visible somewhat edematous, but was successfully  cannulated with an angle-tipped Glidewire.  An open-ended catheter was advanced and right retrograde pyelogram was obtained.  Right pyelogram demonstrated a single right ureter single system right kidney.  There was a filling defect in the distal left ureter consistent with known stone and a small ureterocele.  There is no hydronephrosis.  A ZIPwire was advanced to lower pole  and set aside as a safety wire.  An 8-French feeding tube was placed in the urinary bladder for pressure release, and semirigid ureteroscopy performed the distal right ureter.  The ureteral orifice was fortunately quite capacious which communicated to a  ureterocele as expected with a stone in it.  It appeared too large for simple basketing.  As such, holmium laser energy applied to stone using escalating energy settings to settings of 0.5 joules and 30 Hz.  Using a dusting technique, approximately 80%  of stone volume was ablated.  The remaining 20% volume was fragmented and fragments removed and set aside with an escape basket.  Inspection of the remaining distal 4/5 right ureter revealed no distal calcifications.  As the goal today was to render  stone free, there was some question of intrarenal stones.  The semirigid scope was exchanged for a 12/14 short length ureteral access sheath over a sensor working wire to the level of the proximal ureter using continuous fluoroscopic guidance and  flexible digital ureteroscopy performed at the proximal ureter and right kidney.  As anticipated, there was a right lower  pole  stone.  It was repositioned with escape basket into an upper pole calix and then similarly approximately 50% of its volume  ablated using a dusting technique.  The remaining 50% was amenable to simple basketing and removed.  The access sheath was removed under continuous vision, no mucosal abnormalities were found.  Given the ureterocele was already fairly wide mouthed at the  orifice, it was not felt that a formal incision would be warranted as it was not obstructing, but I did feel that stenting with a nontethered stent would be warranted given the large volume stone that was dusted.  As such, a new 5 x 22 Polaris-type  stent was placed remaining safety wire using cystoscopic and fluoroscopic guidance.  Good proximal and distal plane were noted.  Bladder was emptied.  Procedure terminated.  The patient tolerated the procedure well.  No immediate perioperative  complications.  The patient was taken to the postanesthesia care in stable condition.  Plan for discharge home.  TN/NUANCE  D:11/23/2019 T:11/23/2019 JOB:[id number]
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
## 2019-11-23 NOTE — Anesthesia Procedure Notes (Signed)
Procedure Name: LMA Insertion Date/Time: 11/23/2019 7:25 AM Performed by: Suan Halter, CRNA Pre-anesthesia Checklist: Patient identified, Emergency Drugs available, Suction available and Patient being monitored Patient Re-evaluated:Patient Re-evaluated prior to induction Oxygen Delivery Method: Circle system utilized Preoxygenation: Pre-oxygenation with 100% oxygen Induction Type: IV induction Ventilation: Mask ventilation without difficulty LMA: LMA inserted LMA Size: 4.0 Number of attempts: 1 Airway Equipment and Method: Bite block Placement Confirmation: positive ETCO2 Tube secured with: Tape Dental Injury: Teeth and Oropharynx as per pre-operative assessment

## 2019-11-23 NOTE — Brief Op Note (Signed)
11/23/2019  8:31 AM  PATIENT:  Julie Blair  48 y.o. female  PRE-OPERATIVE DIAGNOSIS:  RIGHT URETERAL STONES POSSIBLE BLADDER STONES  POST-OPERATIVE DIAGNOSIS:  RIGHT URETERAL STONES POSSIBLE BLADDER STONES  PROCEDURE:  Procedure(s) with comments: CYSTOSCOPY WITH RETROGRADE PYELOGRAM, URETEROSCOPY AND STENT PLACEMENT (Right) - 90 MINS HOLMIUM LASER APPLICATION (Right)  SURGEON:  Surgeon(s) and Role:    Alexis Frock, MD - Primary  PHYSICIAN ASSISTANT:   ASSISTANTS: none   ANESTHESIA:   general  EBL:  minimal   BLOOD ADMINISTERED:none  DRAINS: none   LOCAL MEDICATIONS USED:  NONE  SPECIMEN:  Source of Specimen:  Rt renal / ureteral stone fragments  DISPOSITION OF SPECIMEN:  [facility] and several given to pt  COUNTS:  YES  TOURNIQUET:  * No tourniquets in log *  DICTATION: .Other Dictation: Dictation Number  [id number]  PLAN OF CARE: Discharge to home after PACU  PATIENT DISPOSITION:  PACU - hemodynamically stable.   Delay start of Pharmacological VTE agent (>24hrs) due to surgical blood loss or risk of bleeding: yes

## 2019-11-23 NOTE — Anesthesia Postprocedure Evaluation (Signed)
Anesthesia Post Note  Patient: Julie Blair  Procedure(s) Performed: CYSTOSCOPY WITH RETROGRADE PYELOGRAM, URETEROSCOPY AND STENT PLACEMENT (Right Pelvis) HOLMIUM LASER APPLICATION (Right Pelvis)     Patient location during evaluation: PACU Anesthesia Type: General Level of consciousness: awake and alert, oriented and patient cooperative Pain management: pain level controlled Vital Signs Assessment: post-procedure vital signs reviewed and stable Respiratory status: spontaneous breathing, nonlabored ventilation and respiratory function stable Cardiovascular status: blood pressure returned to baseline and stable Postop Assessment: no apparent nausea or vomiting Anesthetic complications: no    Last Vitals:  Vitals:   11/23/19 0845 11/23/19 0900  BP: 113/82 114/79  Pulse: 64 (!) 53  Resp: 12 11  Temp:    SpO2: 98% 98%    Last Pain:  Vitals:   11/23/19 0915  TempSrc:   PainSc: [place]

## 2019-11-23 NOTE — Discharge Instructions (Signed)
CYSTOSCOPY HOME CARE INSTRUCTIONS  Activity: Rest for the remainder of the day.  Do not drive or operate equipment today.  You may resume normal activities in one to two days as instructed by your physician.   Meals: Drink plenty of liquids and eat light foods such as gelatin or soup this evening.  You may return to a normal meal plan tomorrow.  Return to Work: You may return to work in one to two days or as instructed by your physician.  Special Instructions / Symptoms: Call your physician if any of these symptoms occur:   -persistent or heavy bleeding  -bleeding which continues after first few urination  -large blood clots that are difficult to pass  -urine stream diminishes or stops completely  -fever equal to or higher than 101 degrees Farenheit.  -cloudy urine with a strong, foul odor  -severe pain  Females should always wipe from front to back after elimination.  You may feel some burning pain when you urinate.  This should disappear with time.  Applying moist heat to the lower abdomen or a hot tub bath may help relieve the pain. \  Follow-Up / Date of Return Visit to Your Physician: as instructed      Call for an appointment to arrange follow-up.  Patient Signature:  ________________________________________________________  Nurse's Signature:  ________________________________________________________ 1 - You may have urinary urgency (bladder spasms) and bloody urine on / off with stent in place. This is normal.  2 - Call MD or go to ER for fever >102, severe pain / nausea / vomiting not relieved by medications, or acute change in medical status     Post Anesthesia Home Care Instructions  Activity: Get plenty of rest for the remainder of the day. A responsible individual must stay with you for 24 hours following the procedure.  For the next 24 hours, DO NOT: -Drive a car -Paediatric nurse -Drink alcoholic beverages -Take any medication unless instructed by your  physician -Make any legal decisions or sign important papers.  Meals: Start with liquid foods such as gelatin or soup. Progress to regular foods as tolerated. Avoid greasy, spicy, heavy foods. If nausea and/or vomiting occur, drink only clear liquids until the nausea and/or vomiting subsides. Call your physician if vomiting continues.  Special Instructions/Symptoms: Your throat may feel dry or sore from the anesthesia or the breathing tube placed in your throat during surgery. If this causes discomfort, gargle with warm salt water. The discomfort should disappear within 24 hours.  If you had a scopolamine patch placed behind your ear for the management of post- operative nausea and/or vomiting:  1. The medication in the patch is effective for 72 hours, after which it should be removed.  Wrap patch in a tissue and discard in the trash. Wash hands thoroughly with soap and water. 2. You may remove the patch earlier than 72 hours if you experience unpleasant side effects which may include dry mouth, dizziness or visual disturbances. 3. Avoid touching the patch. Wash your hands with soap and water after contact with the patch.

## 2019-11-23 NOTE — Progress Notes (Addendum)
Call to Dr.T.  Tresa Moore about when to resume aspirin and Ibuprofen.  Okay to resume when no longer taking pain med prescribed by him.  Patient and spouse instructed and verbalized understanding.  Also instructed patient not to exceed maximum 24 hour dosage, 4,000 mg, of Tylenol, making sure to include the 325mg  of tylenol in each oxycodone tablet in that total.

## 2019-11-26 ENCOUNTER — Encounter (HOSPITAL_BASED_OUTPATIENT_CLINIC_OR_DEPARTMENT_OTHER): Payer: Self-pay | Admitting: Urology

## 2019-11-26 NOTE — Progress Notes (Signed)
Left Message

## 2019-12-11 ENCOUNTER — Other Ambulatory Visit: Payer: Self-pay | Admitting: Urology

## 2019-12-11 ENCOUNTER — Encounter (HOSPITAL_BASED_OUTPATIENT_CLINIC_OR_DEPARTMENT_OTHER): Payer: Self-pay | Admitting: Urology

## 2019-12-11 ENCOUNTER — Other Ambulatory Visit: Payer: Self-pay

## 2019-12-11 NOTE — Progress Notes (Signed)
Spoke with patient via telephone for pre op interview. No solid foods after MN. Patient may have clear liquids until 0815. Patient verbalized understanding of clear liquid diet. Patient to take synthroid and lipitor with a sip of water AM of surgery. Will need UPT AM of surgery. Arrival time 1215.  Previously reviewed by Konrad Felix, PA for previous procedure 11/23/2019, ok to proceed. No changes since last procedure.

## 2019-12-15 ENCOUNTER — Other Ambulatory Visit (HOSPITAL_COMMUNITY)
Admission: RE | Admit: 2019-12-15 | Discharge: 2019-12-15 | Disposition: A | Discharge status: Home / Self Care | Payer: BC Managed Care – PPO | Source: Ambulatory Visit | Attending: Urology | Admitting: Urology

## 2019-12-15 DIAGNOSIS — Z01812 Encounter for preprocedural laboratory examination: Secondary | ICD-10-CM | POA: Diagnosis present

## 2019-12-15 DIAGNOSIS — Z20828 Contact with and (suspected) exposure to other viral communicable diseases: Secondary | ICD-10-CM | POA: Insufficient documentation

## 2019-12-16 LAB — NOVEL CORONAVIRUS, NAA (HOSP ORDER, SEND-OUT TO REF LAB; TAT 18-24 HRS): SARS-CoV-2, NAA: NOT DETECTED

## 2019-12-19 ENCOUNTER — Ambulatory Visit (HOSPITAL_BASED_OUTPATIENT_CLINIC_OR_DEPARTMENT_OTHER): Payer: BC Managed Care – PPO | Admitting: Anesthesiology

## 2019-12-19 ENCOUNTER — Ambulatory Visit (HOSPITAL_BASED_OUTPATIENT_CLINIC_OR_DEPARTMENT_OTHER)
Admission: RE | Admit: 2019-12-19 | Discharge: 2019-12-19 | Disposition: A | Discharge status: Home / Self Care | Payer: BC Managed Care – PPO | Attending: Urology | Admitting: Urology

## 2019-12-19 ENCOUNTER — Encounter (HOSPITAL_BASED_OUTPATIENT_CLINIC_OR_DEPARTMENT_OTHER): Payer: Self-pay | Admitting: Urology

## 2019-12-19 ENCOUNTER — Encounter (HOSPITAL_BASED_OUTPATIENT_CLINIC_OR_DEPARTMENT_OTHER)
Admission: RE | Disposition: A | Discharge status: Home / Self Care | Payer: Self-pay | Source: Home / Self Care | Attending: Urology

## 2019-12-19 DIAGNOSIS — E785 Hyperlipidemia, unspecified: Secondary | ICD-10-CM | POA: Diagnosis not present

## 2019-12-19 DIAGNOSIS — Z7982 Long term (current) use of aspirin: Secondary | ICD-10-CM | POA: Diagnosis not present

## 2019-12-19 DIAGNOSIS — Z79899 Other long term (current) drug therapy: Secondary | ICD-10-CM | POA: Diagnosis not present

## 2019-12-19 DIAGNOSIS — F329 Major depressive disorder, single episode, unspecified: Secondary | ICD-10-CM | POA: Diagnosis not present

## 2019-12-19 DIAGNOSIS — M1990 Unspecified osteoarthritis, unspecified site: Secondary | ICD-10-CM | POA: Insufficient documentation

## 2019-12-19 DIAGNOSIS — E039 Hypothyroidism, unspecified: Secondary | ICD-10-CM | POA: Diagnosis not present

## 2019-12-19 DIAGNOSIS — Z8673 Personal history of transient ischemic attack (TIA), and cerebral infarction without residual deficits: Secondary | ICD-10-CM | POA: Diagnosis not present

## 2019-12-19 DIAGNOSIS — Z466 Encounter for fitting and adjustment of urinary device: Secondary | ICD-10-CM | POA: Insufficient documentation

## 2019-12-19 DIAGNOSIS — Z87891 Personal history of nicotine dependence: Secondary | ICD-10-CM | POA: Insufficient documentation

## 2019-12-19 HISTORY — PX: CYSTOSCOPY W/ URETERAL STENT REMOVAL: SHX1430

## 2019-12-19 HISTORY — PX: CYSTOSCOPY/RETROGRADE/URETEROSCOPY: SHX5316

## 2019-12-19 LAB — POCT I-STAT, CHEM 8
BUN: 10 mg/dL (ref 6–20)
Calcium, Ion: 1.23 mmol/L (ref 1.15–1.40)
Chloride: 107 mmol/L (ref 98–111)
Creatinine, Ser: 0.7 mg/dL (ref 0.44–1.00)
Glucose, Bld: 89 mg/dL (ref 70–99)
HCT: 38 % (ref 36.0–46.0)
Hemoglobin: 12.9 g/dL (ref 12.0–15.0)
Potassium: 4 mmol/L (ref 3.5–5.1)
Sodium: 140 mmol/L (ref 135–145)
TCO2: 22 mmol/L (ref 22–32)

## 2019-12-19 SURGERY — CYSTOSCOPY/RETROGRADE/URETEROSCOPY
Anesthesia: General | Laterality: Right

## 2019-12-19 MED ORDER — LIDOCAINE 2% (20 MG/ML) 5 ML SYRINGE
INTRAMUSCULAR | Status: DC | PRN
  Administered 2019-12-19: 100 mg via INTRAVENOUS

## 2019-12-19 MED ORDER — FENTANYL CITRATE (PF) 100 MCG/2ML IJ SOLN
INTRAMUSCULAR | Status: AC
  Filled 2019-12-19: qty 2

## 2019-12-19 MED ORDER — PROPOFOL 10 MG/ML IV BOLUS
INTRAVENOUS | Status: DC | PRN
  Administered 2019-12-19: 75 ug/kg/min via INTRAVENOUS

## 2019-12-19 MED ORDER — MIDAZOLAM HCL 2 MG/2ML IJ SOLN
INTRAMUSCULAR | Status: AC
  Filled 2019-12-19: qty 2

## 2019-12-19 MED ORDER — PROPOFOL 10 MG/ML IV BOLUS
INTRAVENOUS | Status: AC
  Filled 2019-12-19: qty 20

## 2019-12-19 MED ORDER — GENTAMICIN SULFATE 40 MG/ML IJ SOLN
5.0000 mg/kg | INTRAVENOUS | Status: DC
  Filled 2019-12-19: qty 8.25

## 2019-12-19 MED ORDER — SCOPOLAMINE 1 MG/3DAYS TD PT72
MEDICATED_PATCH | TRANSDERMAL | Status: AC
  Filled 2019-12-19: qty 1

## 2019-12-19 MED ORDER — KETOROLAC TROMETHAMINE 30 MG/ML IJ SOLN
INTRAMUSCULAR | Status: DC | PRN
  Administered 2019-12-19: 30 mg via INTRAVENOUS

## 2019-12-19 MED ORDER — GENTAMICIN SULFATE 40 MG/ML IJ SOLN
5.0000 mg/kg | INTRAVENOUS | Status: AC
  Administered 2019-12-19: 14:00:00 290 mg via INTRAVENOUS
  Filled 2019-12-19: qty 7.25

## 2019-12-19 MED ORDER — SCOPOLAMINE 1 MG/3DAYS TD PT72
1.0000 | MEDICATED_PATCH | TRANSDERMAL | Status: DC
  Administered 2019-12-19: 1.5 mg via TRANSDERMAL
  Filled 2019-12-19: qty 1

## 2019-12-19 MED ORDER — KETOROLAC TROMETHAMINE 30 MG/ML IJ SOLN
INTRAMUSCULAR | Status: AC
  Filled 2019-12-19: qty 1

## 2019-12-19 MED ORDER — LACTATED RINGERS IV SOLN
INTRAVENOUS | Status: DC
  Filled 2019-12-19: qty 1000

## 2019-12-19 MED ORDER — LIDOCAINE 2% (20 MG/ML) 5 ML SYRINGE
INTRAMUSCULAR | Status: AC
  Filled 2019-12-19: qty 5

## 2019-12-19 SURGICAL SUPPLY — 23 items
BAG DRAIN URO-CYSTO SKYTR STRL (DRAIN) ×2 IMPLANT
BAG DRN UROCATH (DRAIN) ×1
BASKET LASER NITINOL 1.9FR (BASKET) IMPLANT
BSKT STON RTRVL 120 1.9FR (BASKET)
CATH INTERMIT  6FR 70CM (CATHETERS) IMPLANT
CLOTH BEACON ORANGE TIMEOUT ST (SAFETY) ×2 IMPLANT
FIBER LASER FLEXIVA 365 (UROLOGICAL SUPPLIES) IMPLANT
FIBER LASER TRAC TIP (UROLOGICAL SUPPLIES) IMPLANT
GLOVE BIO SURGEON STRL SZ7.5 (GLOVE) ×2 IMPLANT
GOWN STRL REUS W/TWL LRG LVL3 (GOWN DISPOSABLE) ×2 IMPLANT
GUIDEWIRE ANG ZIPWIRE 038X150 (WIRE) ×2 IMPLANT
GUIDEWIRE STR DUAL SENSOR (WIRE) ×2 IMPLANT
IV NS 1000ML (IV SOLUTION) ×2
IV NS 1000ML BAXH (IV SOLUTION) ×1 IMPLANT
IV NS IRRIG 3000ML ARTHROMATIC (IV SOLUTION) ×2 IMPLANT
KIT TURNOVER CYSTO (KITS) ×2 IMPLANT
MANIFOLD NEPTUNE II (INSTRUMENTS) ×2 IMPLANT
NS IRRIG 500ML POUR BTL (IV SOLUTION) ×2 IMPLANT
PACK CYSTO (CUSTOM PROCEDURE TRAY) ×2 IMPLANT
SYR 10ML LL (SYRINGE) ×2 IMPLANT
TUBE CONNECTING 12X1/4 (SUCTIONS) ×2 IMPLANT
TUBE FEEDING 8FR 16IN STR KANG (MISCELLANEOUS) IMPLANT
TUBING UROLOGY SET (TUBING) ×2 IMPLANT

## 2019-12-19 NOTE — Discharge Instructions (Signed)
1 - You may have urinary urgency (bladder spasms) and bloody urine on / off x few days. This is normal.  2 - Call MD or go to ER for fever >102, severe pain / nausea / vomiting not relieved by medications, or acute change in medical status    Post Anesthesia Home Care Instructions  Activity: Get plenty of rest for the remainder of the day. A responsible individual must stay with you for 24 hours following the procedure.  For the next 24 hours, DO NOT: -Drive a car -Paediatric nurse -Drink alcoholic beverages -Take any medication unless instructed by your physician -Make any legal decisions or sign important papers.  Meals: Start with liquid foods such as gelatin or soup. Progress to regular foods as tolerated. Avoid greasy, spicy, heavy foods. If nausea and/or vomiting occur, drink only clear liquids until the nausea and/or vomiting subsides. Call your physician if vomiting continues.  Special Instructions/Symptoms: Your throat may feel dry or sore from the anesthesia or the breathing tube placed in your throat during surgery. If this causes discomfort, gargle with warm salt water. The discomfort should disappear within 24 hours.  If you had a scopolamine patch placed behind your ear for the management of post- operative nausea and/or vomiting:  1. The medication in the patch is effective for 72 hours, after which it should be removed.  Wrap patch in a tissue and discard in the trash. Wash hands thoroughly with soap and water. 2. You may remove the patch earlier than 72 hours if you experience unpleasant side effects which may include dry mouth, dizziness or visual disturbances. 3. Avoid touching the patch. Wash your hands with soap and water after contact with the patch.
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
## 2019-12-19 NOTE — Op Note (Signed)
NAME: Julie, Blair MEDICAL RECORD [id number] ACCOUNT [id number] DATE OF BIRTH:10/05/1971 FACILITY: [facility] LOCATION: [facility] PHYSICIAN:Gumaro Brightbill Tresa Moore, MD  OPERATIVE REPORT  DATE OF PROCEDURE:  12/19/2019  SURGEON:  Alexis Frock, MD  PREOPERATIVE DIAGNOSIS:  Retained right ureteral stent with proximal migration.  PROCEDURE:  Right ureteroscopy with retrieval of foreign body.  ESTIMATED BLOOD LOSS:  Nil.  COMPLICATIONS:  None.  SPECIMEN:  Right ureteral stent for discard.  FINDINGS:   1.  Proximal migration of right ureteral stent distal end, approximately 3 cm proximal to the UVJ. 2.  Successful removal of right ureteral stent.  INDICATIONS:  The patient is a very pleasant 48 year old woman with recent history of an enlarging right distal ureteral stone and a nonobstructing ureterocele.  She underwent ureteroscopic management of this several weeks ago in an uncomplicated fashion  and had a nontethered stent placed at that time to allow for sufficient healing and fragment passage.  She was seen in the office for routine cystoscopic removal.  However, the stent had migrated proximally such that the distal end was not in the  urinary bladder, therefore not amenable to office cystoscopic removal.  Options were discussed, including recommended path of ureteroscopic removal under sedation versus general anesthesia, and she wished to proceed.  She presents for this today.   Informed consent was obtained and placed in the medical record.  DESCRIPTION OF PROCEDURE:  The patient being identified, the procedure being right stent removal was confirmed.  Procedure timeout was performed.  Intravenous antibiotics administered.   Heavy monitored anesthesia care sedation was administered.  A  semirigid ureteroscope was then used to perform cystoscopy and inspection of the right distal ureter.  The distal end of the stent was seen in a position approximately 3 cm proximal to the ureterovesical  junction.  It was grasped with flexible cold  graspers and then completely removed.  It was inspected and intact and discarded.  Procedure was terminated.  The patient tolerated the procedure well.  No immediate perioperative complications.  The patient was taken to postanesthesia care in stable  condition.  Plan for discharge home.  VN/NUANCE  D:12/19/2019 T:12/19/2019 JOB:[id number]

## 2019-12-19 NOTE — Brief Op Note (Signed)
12/19/2019  2:30 PM  PATIENT:  Julie Blair  48 y.o. female  PRE-OPERATIVE DIAGNOSIS:  RETAINED RIGHT URETERAL STENT  POST-OPERATIVE DIAGNOSIS:  * No post-op diagnosis entered *  PROCEDURE:  Procedure(s) with comments: CYSTOSCOPY/RETROGRADE/URETEROSCOPY (Right) - 30 MINS CYSTOSCOPY WITH STENT REMOVAL (Right)  SURGEON:  Surgeon(s) and Role:    * Alexis Frock, MD - Primary  PHYSICIAN ASSISTANT:   ASSISTANTS: none   ANESTHESIA:   MAC  EBL:  minimal   BLOOD ADMINISTERED:none  DRAINS: none   LOCAL MEDICATIONS USED:  NONE  SPECIMEN:  Source of Specimen:  Rt ureteral stent  DISPOSITION OF SPECIMEN:  discard  COUNTS:  YES  TOURNIQUET:  * No tourniquets in log *  DICTATION: .Other Dictation: Dictation Number [id number]  PLAN OF CARE: Discharge to home after PACU  PATIENT DISPOSITION:  PACU - hemodynamically stable.   Delay start of Pharmacological VTE agent (>24hrs) due to surgical blood loss or risk of bleeding: not applicable

## 2019-12-19 NOTE — Anesthesia Procedure Notes (Signed)
Procedure Name: MAC Date/Time: 12/19/2019 2:21 PM Performed by: Bonney Aid, CRNA Pre-anesthesia Checklist: Patient identified, Timeout performed, Emergency Drugs available, Suction available and Patient being monitored Patient Re-evaluated:Patient Re-evaluated prior to induction Oxygen Delivery Method: Nasal cannula Placement Confirmation: positive ETCO2

## 2019-12-19 NOTE — Transfer of Care (Signed)
Immediate Anesthesia Transfer of Care Note  Patient: Julie Blair  Procedure(s) Performed: CYSTOSCOPY/RETROGRADE/URETEROSCOPY (Right ) CYSTOSCOPY WITH STENT REMOVAL (Right )  Patient Location: PACU and Short Stay  Anesthesia Type:MAC  Level of Consciousness: awake, alert  and oriented  Airway & Oxygen Therapy: Patient Spontanous Breathing  Post-op Assessment: Report given to RN  Post vital signs: Reviewed and stable  Last Vitals: 112/67, 84, 100% Vitals Value Taken Time  BP    Temp    Pulse    Resp    SpO2      Last Pain:  Vitals:   12/19/19 1234  TempSrc: Oral  PainSc: 1       Patients Stated Pain Goal: 3 (61/22/44 9753)  Complications: No apparent anesthesia complications

## 2019-12-20 NOTE — Anesthesia Postprocedure Evaluation (Signed)
Anesthesia Post Note  Patient: Julie Blair  Procedure(s) Performed: CYSTOSCOPY/RETROGRADE/URETEROSCOPY (Right ) CYSTOSCOPY WITH STENT REMOVAL (Right )     Patient location during evaluation: PACU Anesthesia Type: MAC Level of consciousness: awake and alert Pain management: pain level controlled Vital Signs Assessment: post-procedure vital signs reviewed and stable Respiratory status: spontaneous breathing, nonlabored ventilation and respiratory function stable Cardiovascular status: stable and blood pressure returned to baseline Postop Assessment: no apparent nausea or vomiting Anesthetic complications: no    Last Vitals:  Vitals:   12/19/19 1440 12/19/19 1525  BP: 113/83 109/81  Pulse: 82 78  Resp: 14 16  Temp: 37 C 37.1 C  SpO2: 100% 100%    Last Pain:  Vitals:   12/19/19 1525  TempSrc:   PainSc: 0-No pain                 Lynda Rainwater

## 2020-02-23 ENCOUNTER — Ambulatory Visit: Payer: BC Managed Care – PPO | Attending: Internal Medicine

## 2020-02-23 DIAGNOSIS — Z23 Encounter for immunization: Secondary | ICD-10-CM

## 2020-02-23 NOTE — Progress Notes (Signed)
   Covid-19 Vaccination Clinic  Name:  Julie Blair    MRN: [id number] DOB: 12/26/1970  02/23/2020  Julie Blair was observed post Covid-19 immunization for 15 minutes without incident. She was provided with Vaccine Information Sheet and instruction to access the V-Safe system.   Julie Blair was instructed to call 911 with any severe reactions post vaccine: Marland Kitchen Difficulty breathing  . Swelling of face and throat  . A fast heartbeat  . A bad rash all over body  . Dizziness and weakness   Immunizations Administered    Name Date Dose VIS Date Route   Pfizer COVID-19 Vaccine 02/23/2020  2:52 PM 0.3 mL 11/30/2019 Intramuscular   Manufacturer: [place]   Lot: [id number]   [place]: [id number]

## 2020-02-27 ENCOUNTER — Ambulatory Visit: Payer: BC Managed Care – PPO | Admitting: Rehabilitative and Restorative Service Providers"

## 2020-02-27 ENCOUNTER — Other Ambulatory Visit: Payer: Self-pay

## 2020-02-27 DIAGNOSIS — R42 Dizziness and giddiness: Secondary | ICD-10-CM

## 2020-02-27 DIAGNOSIS — R2689 Other abnormalities of gait and mobility: Secondary | ICD-10-CM

## 2020-02-27 DIAGNOSIS — R29818 Other symptoms and signs involving the nervous system: Secondary | ICD-10-CM | POA: Diagnosis not present

## 2020-02-27 DIAGNOSIS — M25552 Pain in left hip: Secondary | ICD-10-CM | POA: Diagnosis not present

## 2020-02-27 NOTE — Patient Instructions (Signed)
Access Code: [email or web address]  URL: [email or web address]  Date: 02/27/2020  Prepared by: Rudell Cobb   Program Notes  To Begin: Start performing 1x per day (after work hours) to ensure no lasting headache or worsening fatigue. Progress to 2x per day on the weekends to determine your tolerance.   Exercises Romberg Stance with Head Nods - 10 reps - 1 sets - 2x daily - 7x weekly Romberg Stance with Head Rotation - 10 reps - 1 sets - 2x daily - 7x weekly Standing Gaze Stabilization with Head Rotation - 1 reps - 1 sets - 30 seconds hold - 2x daily - 7x weekly Standing Gaze Stabilization with Two Near Targets and Head Rotation - 5-10 reps - 1 sets - 2x daily - 7x weekly Romberg Stance with Eyes Closed - 3 reps - 1 sets - 20-30 seconds hold - 2x daily - 7x weekly
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
## 2020-03-03 ENCOUNTER — Other Ambulatory Visit: Payer: Self-pay

## 2020-03-03 ENCOUNTER — Encounter: Payer: Self-pay | Admitting: Rehabilitative and Restorative Service Providers"

## 2020-03-03 ENCOUNTER — Ambulatory Visit: Payer: BC Managed Care – PPO | Admitting: Rehabilitative and Restorative Service Providers"

## 2020-03-03 DIAGNOSIS — M25552 Pain in left hip: Secondary | ICD-10-CM | POA: Diagnosis not present

## 2020-03-03 DIAGNOSIS — R42 Dizziness and giddiness: Secondary | ICD-10-CM

## 2020-03-03 DIAGNOSIS — R29818 Other symptoms and signs involving the nervous system: Secondary | ICD-10-CM | POA: Diagnosis not present

## 2020-03-03 DIAGNOSIS — R2689 Other abnormalities of gait and mobility: Secondary | ICD-10-CM
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
## 2020-03-07 ENCOUNTER — Ambulatory Visit: Payer: BC Managed Care – PPO | Admitting: Rehabilitative and Restorative Service Providers"

## 2020-03-07 ENCOUNTER — Other Ambulatory Visit: Payer: Self-pay

## 2020-03-07 DIAGNOSIS — M25552 Pain in left hip: Secondary | ICD-10-CM | POA: Diagnosis not present

## 2020-03-07 DIAGNOSIS — M6281 Muscle weakness (generalized): Secondary | ICD-10-CM | POA: Diagnosis not present

## 2020-03-07 DIAGNOSIS — R2689 Other abnormalities of gait and mobility: Secondary | ICD-10-CM | POA: Diagnosis not present

## 2020-03-07 DIAGNOSIS — R29898 Other symptoms and signs involving the musculoskeletal system: Secondary | ICD-10-CM

## 2020-03-07 NOTE — Patient Instructions (Signed)
Access Code: [email or web address] URL: [email or web address] Date: 03/07/2020 Prepared by: Rudell Cobb  Program Notes Self mobilization L hip:  Place L LE in heavy resistance band and lie down on the floor.  Scoot back to get mild pull at L leg and hold x 3-5 minutes (or to tolerance).   Exercises Sidelying Hip Abduction - 2 x daily - 7 x weekly - 1 sets - 10 reps - 5 seconds hold Supine Bridge - 2 x daily - 7 x weekly - 10 reps - 1 sets [employer] on Table - 2 x daily - 7 x weekly - 1 sets - 1 reps - 30 seconds hold
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
## 2020-03-14 ENCOUNTER — Ambulatory Visit: Payer: BC Managed Care – PPO | Admitting: Rehabilitative and Restorative Service Providers"

## 2020-03-14 ENCOUNTER — Other Ambulatory Visit: Payer: Self-pay

## 2020-03-14 ENCOUNTER — Encounter: Payer: Self-pay | Admitting: Rehabilitative and Restorative Service Providers"

## 2020-03-14 DIAGNOSIS — R29818 Other symptoms and signs involving the nervous system: Secondary | ICD-10-CM | POA: Diagnosis not present

## 2020-03-14 DIAGNOSIS — R42 Dizziness and giddiness: Secondary | ICD-10-CM | POA: Diagnosis not present

## 2020-03-14 DIAGNOSIS — R2689 Other abnormalities of gait and mobility: Secondary | ICD-10-CM

## 2020-03-14 NOTE — Patient Instructions (Signed)
Access Code: [email or web address] URL: [email or web address] Date: 03/14/2020 Prepared by: Rudell Cobb  Program Notes To Begin: Start performing 1x per day (after work hours) to ensure no lasting headache or worsening fatigue. Progress to 2x per day on the weekends to determine your tolerance.   Exercises Standing Gaze Stabilization with Head Rotation - 2 x daily - 7 x weekly - 1 reps - 1 sets - 30 seconds hold Standing Gaze Stabilization with Two Near Targets and Head Rotation - 2 x daily - 7 x weekly - 5-10 reps - 1 sets Wide Stance with Head Rotation on Foam Pad - 2 x daily - 7 x weekly - 1 sets - 10 reps Wide Stance with Eyes Closed and Head Nods on Foam Pad - 2 x daily - 7 x weekly - 1 sets - 10 reps Wide Stance with Eyes Closed on Foam Pad - 2 x daily - 7 x weekly - 1 sets - 3 reps - 30 seconds hold
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
## 2020-03-17 ENCOUNTER — Other Ambulatory Visit: Payer: Self-pay

## 2020-03-17 ENCOUNTER — Ambulatory Visit (INDEPENDENT_AMBULATORY_CARE_PROVIDER_SITE_OTHER): Payer: BC Managed Care – PPO | Admitting: Rehabilitative and Restorative Service Providers"

## 2020-03-17 DIAGNOSIS — R2689 Other abnormalities of gait and mobility: Secondary | ICD-10-CM | POA: Diagnosis not present

## 2020-03-17 DIAGNOSIS — M6281 Muscle weakness (generalized): Secondary | ICD-10-CM | POA: Diagnosis not present

## 2020-03-17 DIAGNOSIS — R29898 Other symptoms and signs involving the musculoskeletal system: Secondary | ICD-10-CM

## 2020-03-17 DIAGNOSIS — M25552 Pain in left hip: Secondary | ICD-10-CM | POA: Diagnosis not present

## 2020-03-17 LAB — TSH: TSH: 1.91 (ref 0.41–5.90)

## 2020-03-17 NOTE — Patient Instructions (Signed)
Access Code: [email or web address] URL: [email or web address] Date: 03/17/2020 Prepared by: Rudell Cobb  Program Notes Self mobilization L hip:  Place L LE in heavy resistance band and lie down on the floor.  Scoot back to get mild pull at L leg and hold x 3-5 minutes (or to tolerance).   Exercises Sidelying Hip Abduction - 2 x daily - 7 x weekly - 1 sets - 10 reps - 5 seconds hold Active Straight Leg Raise with Quad Set - 2 x daily - 7 x weekly - 10 reps - 1 sets Supine Bridge - 2 x daily - 7 x weekly - 10 reps - 1 sets [employer] on Table - 2 x daily - 7 x weekly - 1 sets - 1 reps - 30 seconds hold Prone Hip Extension with Bent Knee - One Pillow - 2 x daily - 7 x weekly - 1 sets - 10 reps
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
## 2020-03-20 ENCOUNTER — Encounter: Payer: BC Managed Care – PPO | Admitting: Rehabilitative and Restorative Service Providers"

## 2020-03-24 ENCOUNTER — Ambulatory Visit (INDEPENDENT_AMBULATORY_CARE_PROVIDER_SITE_OTHER): Payer: BC Managed Care – PPO | Admitting: Rehabilitative and Restorative Service Providers"

## 2020-03-24 ENCOUNTER — Other Ambulatory Visit: Payer: Self-pay

## 2020-03-24 ENCOUNTER — Ambulatory Visit: Payer: BC Managed Care – PPO

## 2020-03-24 DIAGNOSIS — R29898 Other symptoms and signs involving the musculoskeletal system: Secondary | ICD-10-CM

## 2020-03-24 DIAGNOSIS — R2689 Other abnormalities of gait and mobility: Secondary | ICD-10-CM

## 2020-03-24 DIAGNOSIS — R29818 Other symptoms and signs involving the nervous system: Secondary | ICD-10-CM

## 2020-03-24 DIAGNOSIS — R42 Dizziness and giddiness: Secondary | ICD-10-CM | POA: Diagnosis not present
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
## 2020-03-26 ENCOUNTER — Ambulatory Visit: Payer: BC Managed Care – PPO | Attending: Internal Medicine

## 2020-03-26 DIAGNOSIS — Z23 Encounter for immunization: Secondary | ICD-10-CM

## 2020-03-26 NOTE — Progress Notes (Signed)
   Covid-19 Vaccination Clinic  Name:  Julie Blair    MRN: [id number] DOB: 1971-11-14  03/26/2020  [name] was observed post Covid-19 immunization for 15 minutes without incident. She was provided with Vaccine Information Sheet and instruction to access the V-Safe system.   [name] was instructed to call 911 with any severe reactions post vaccine: Marland Kitchen Difficulty breathing  . Swelling of face and throat  . A fast heartbeat  . A bad rash all over body  . Dizziness and weakness   Immunizations Administered    Name Date Dose VIS Date Route   Pfizer COVID-19 Vaccine 03/26/2020  3:46 PM 0.3 mL 11/30/2019 Intramuscular   Manufacturer: [place]   Lot: [id number]   [place]: [id number]

## 2020-03-28 ENCOUNTER — Encounter: Payer: BC Managed Care – PPO | Admitting: Rehabilitative and Restorative Service Providers"

## 2020-04-14 ENCOUNTER — Other Ambulatory Visit: Payer: Self-pay

## 2020-04-14 ENCOUNTER — Encounter: Payer: Self-pay | Admitting: Rehabilitative and Restorative Service Providers"

## 2020-04-14 ENCOUNTER — Ambulatory Visit (INDEPENDENT_AMBULATORY_CARE_PROVIDER_SITE_OTHER): Payer: BC Managed Care – PPO | Admitting: Rehabilitative and Restorative Service Providers"

## 2020-04-14 DIAGNOSIS — R29898 Other symptoms and signs involving the musculoskeletal system: Secondary | ICD-10-CM

## 2020-04-14 DIAGNOSIS — M6281 Muscle weakness (generalized): Secondary | ICD-10-CM | POA: Diagnosis not present

## 2020-04-14 DIAGNOSIS — R2689 Other abnormalities of gait and mobility: Secondary | ICD-10-CM

## 2020-04-14 DIAGNOSIS — M25552 Pain in left hip: Secondary | ICD-10-CM | POA: Diagnosis not present

## 2020-04-14 NOTE — Patient Instructions (Signed)
Access Code: [email or web address] URL: [email or web address] Date: 04/14/2020 Prepared by: Rudell Cobb  Exercises Quad Setting and Stretching - 2 x daily - 7 x weekly - 1 sets - 10 reps - 3 seconds hold Supine Gluteal Sets - 2 x daily - 7 x weekly - 1 sets - 10 reps - 3 seconds hold Hooklying Transversus Abdominis Palpation - 2 x daily - 7 x weekly - 1 sets - 10 reps - 3 seconds hold Supine Single Leg Ankle Pumps - 2 x daily - 7 x weekly - 10 reps - 1 sets
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
## 2020-04-17 ENCOUNTER — Encounter: Payer: Self-pay | Admitting: Rehabilitative and Restorative Service Providers"

## 2020-04-17 ENCOUNTER — Other Ambulatory Visit: Payer: Self-pay

## 2020-04-17 ENCOUNTER — Ambulatory Visit (INDEPENDENT_AMBULATORY_CARE_PROVIDER_SITE_OTHER): Payer: BC Managed Care – PPO | Admitting: Rehabilitative and Restorative Service Providers"

## 2020-04-17 DIAGNOSIS — M6281 Muscle weakness (generalized): Secondary | ICD-10-CM

## 2020-04-17 DIAGNOSIS — R29898 Other symptoms and signs involving the musculoskeletal system: Secondary | ICD-10-CM

## 2020-04-17 DIAGNOSIS — R2689 Other abnormalities of gait and mobility: Secondary | ICD-10-CM | POA: Diagnosis not present

## 2020-04-17 DIAGNOSIS — M25552 Pain in left hip: Secondary | ICD-10-CM
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
## 2020-04-21 ENCOUNTER — Telehealth: Payer: Self-pay | Admitting: Rehabilitative and Restorative Service Providers"

## 2020-04-21 ENCOUNTER — Encounter: Payer: BC Managed Care – PPO | Admitting: Rehabilitative and Restorative Service Providers"

## 2020-04-21 NOTE — Telephone Encounter (Signed)
PT contacted the patient due to heavy rain and no awning at our location.  Recommended we wait until next visit to resume.  PT encouraged prone over 1 pillow at home and continued lying supine with hip at neutral for anterior hip stretching. Aleida Crandell, PT

## 2020-04-24 ENCOUNTER — Encounter: Payer: Self-pay | Admitting: Rehabilitative and Restorative Service Providers"

## 2020-04-24 ENCOUNTER — Other Ambulatory Visit: Payer: Self-pay

## 2020-04-24 ENCOUNTER — Ambulatory Visit (INDEPENDENT_AMBULATORY_CARE_PROVIDER_SITE_OTHER): Payer: BC Managed Care – PPO | Admitting: Rehabilitative and Restorative Service Providers"

## 2020-04-24 DIAGNOSIS — M25552 Pain in left hip: Secondary | ICD-10-CM

## 2020-04-24 DIAGNOSIS — R2689 Other abnormalities of gait and mobility: Secondary | ICD-10-CM

## 2020-04-24 DIAGNOSIS — M6281 Muscle weakness (generalized): Secondary | ICD-10-CM

## 2020-04-24 NOTE — Patient Instructions (Signed)
Access Code: [email or web address] URL: [email or web address] Date: 04/17/2020 Prepared by: Rudell Cobb  Exercises Quad Setting and Stretching - 2 x daily - 7 x weekly - 1 sets - 10 reps - 3 seconds hold Supine Gluteal Sets - 2 x daily - 7 x weekly - 1 sets - 10 reps - 3 seconds hold Hooklying Transversus Abdominis Palpation - 2 x daily - 7 x weekly - 1 sets - 10 reps - 3 seconds hold Supine Single Leg Ankle Pumps - 2 x daily - 7 x weekly - 10 reps - 1 sets Supine Diaphragmatic Breathing - 2 x daily - 7 x weekly - 1 sets - 5 reps Lying Prone - 2 x daily - 7 x weekly - 1 sets - 1 reps - 5 minutes hold
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
## 2020-04-30 ENCOUNTER — Other Ambulatory Visit: Payer: Self-pay

## 2020-04-30 ENCOUNTER — Ambulatory Visit (INDEPENDENT_AMBULATORY_CARE_PROVIDER_SITE_OTHER): Payer: BC Managed Care – PPO | Admitting: Rehabilitative and Restorative Service Providers"

## 2020-04-30 ENCOUNTER — Encounter: Payer: Self-pay | Admitting: Rehabilitative and Restorative Service Providers"

## 2020-04-30 DIAGNOSIS — R2689 Other abnormalities of gait and mobility: Secondary | ICD-10-CM | POA: Diagnosis not present

## 2020-04-30 DIAGNOSIS — M6281 Muscle weakness (generalized): Secondary | ICD-10-CM | POA: Diagnosis not present

## 2020-04-30 DIAGNOSIS — M25552 Pain in left hip: Secondary | ICD-10-CM
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
## 2020-04-30 NOTE — Patient Instructions (Signed)
Access Code: [email or web address] URL: [email or web address] Date: 04/17/2020 Prepared by: Rudell Cobb  Exercises Lying Prone - 2 x daily - 7 x weekly - 1 sets - 1 reps - 5 minutes hold Prone Knee Flexion AROM - 2 x daily - 7 x weekly - 1 sets - 10 reps Supine Heel Slide - 2 x daily - 7 x weekly - 1 sets - 10 reps Bent Knee Fallouts - 2 x daily - 7 x weekly - 1 sets - 10 reps Beginner Bridge - 2 x daily - 7 x weekly - 10 reps - 1 sets

## 2020-05-02 ENCOUNTER — Encounter: Payer: BC Managed Care – PPO | Admitting: Rehabilitative and Restorative Service Providers"

## 2020-05-05 ENCOUNTER — Other Ambulatory Visit: Payer: Self-pay

## 2020-05-05 ENCOUNTER — Ambulatory Visit (INDEPENDENT_AMBULATORY_CARE_PROVIDER_SITE_OTHER): Payer: BC Managed Care – PPO | Admitting: Rehabilitative and Restorative Service Providers"

## 2020-05-05 ENCOUNTER — Encounter: Payer: Self-pay | Admitting: Rehabilitative and Restorative Service Providers"

## 2020-05-05 DIAGNOSIS — M6281 Muscle weakness (generalized): Secondary | ICD-10-CM

## 2020-05-05 DIAGNOSIS — R29898 Other symptoms and signs involving the musculoskeletal system: Secondary | ICD-10-CM

## 2020-05-05 DIAGNOSIS — R2689 Other abnormalities of gait and mobility: Secondary | ICD-10-CM | POA: Diagnosis not present

## 2020-05-05 DIAGNOSIS — M25552 Pain in left hip: Secondary | ICD-10-CM
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
## 2020-05-05 NOTE — Patient Instructions (Signed)
Aquatic Therapy: What to Expect!  Where:  [place]   NOTE: You will receive an automated phone message [place]   reminding you of your appointment and it will say the  [place], [place]  [id number]    appointment is at the [facility] on [address]. We are  [phone]     working to fix this - just know that you will meet [place] at        pool! How to Prepare: . Please make sure you drink 8 ounces of water about one hour prior to your pool session . A caregiver must attend the entire session with the patient.  The caregiver will be responsible for assisting with dressing as well as any toileting needs.  . Please arrive IN YOUR SUIT and a few minutes prior to your appointment - a health screen will be completed as you enter the [place].  . Please make sure to attend to any toileting needs prior to entering the pool . Once on the pool deck your therapist will ask you to sign the Patient  Consent and Assignment of Benefits form . Your therapist may take your blood pressure prior to, during and after your session if indicated  About the pool: 1. Entering the pool Your therapist will assist you; there are multiple ways to enter including stairs with railings, a walk in ramp, a roll in chair and a mechanical lift. Your therapist will determine the most appropriate way for you. 2. Water temperature is usually between 86-87 degrees 3. There may be other swimmers in the pool at the same time   Contact Info:     Appointments: [facility]  All sessions are 45 minutes   [place] [address]    Please call the [facility] if   [place],   [address]    you need to cancel or reschedule an appointment.  [phone]          Aquatic Therapy:  Keeping Everyone Safe!!!  We are so excited to be back in the pool for therapy and can't wait to see you in the water!! Having said that, we also want to make sure that we  keep you, your family member, and everyone one else safe.  We have been in touch with our national aquatic association as well as the CDC to develop safe guidelines for aquatic therapy. First, we want to assure you that the water is one of the safest places to be right now - chlorine and bromine kill the virus and the CDC states the virus cannot be transmitted in a pool; that's great news for [place]! Below are specific guidelines from the CDC that we will ask you and your caregiver to follow when coming to the [facility] for therapy. 1. Please shower AT HOME and COME IN YOUR SUIT and cover up to the pool. 2. Please ensure that you are ON TIME and ready (meaning that you are in your suit and ready to enter the pool) for your appointment - all of our pool appointments are full and there is a waiting list. If you are late, we cannot extend your time in the pool.   3. Locker rooms are open but limited to 4 people at a time. At the end of your session, you can either change into dry clothes in the locker room or plan to leave in your suit Raynald Blend  up and change at home. If you require assistance with this, your caregiver will need to provide that assistance.  4. Follow the Aquatic Center's guidelines to use bathroom/locker room facilities. Signs are posted to provide guidelines for you.  5. The [facility] staff will complete a health screen for you and caregiver as you enter the building.  Once this is completed, PLEASE PROCEED DIRECTLY TO THE POOL DECK. We will be waiting for you there! 6. Masks:  your caregiver must wear a mask at all times. The CDC recommends that patients and therapists wear their masks until we enter the water and then put them back on as we leave the water. PLEASE BRING A PLASTIC BAG TO STORE YOUR MASK IN WHILE WE ARE IN THE POOL.   Additional safety measures: 1. The [place] will be practicing social distancing, wearing masks and implementing a stringent cleaning  program.  2. We will only be using hard surface equipment and we will be cleaning it between all patients. 3. We will be cleaning hand rails used to enter the pool and chairs that your caregiver might use. 4. We as employees of [employer] must complete a screening every day we work prior to our shift. 5. We are only offering aquatic therapy to patients who have been determined to be at low risk for the virus - we are happy to share that screen with you if you are interested.  We are excited to be able to offer aquatic therapy again in addition to services at our outpatient clinic - thank you for the opportunity to serve you!  Vinnie Level [id number], PT    Forde Radon, OTR/L    05/09/19

## 2020-05-08 ENCOUNTER — Ambulatory Visit (INDEPENDENT_AMBULATORY_CARE_PROVIDER_SITE_OTHER): Payer: BC Managed Care – PPO | Admitting: Rehabilitative and Restorative Service Providers"

## 2020-05-08 ENCOUNTER — Encounter: Payer: Self-pay | Admitting: Rehabilitative and Restorative Service Providers"

## 2020-05-08 ENCOUNTER — Other Ambulatory Visit: Payer: Self-pay

## 2020-05-08 DIAGNOSIS — R2689 Other abnormalities of gait and mobility: Secondary | ICD-10-CM | POA: Diagnosis not present

## 2020-05-08 DIAGNOSIS — M25552 Pain in left hip: Secondary | ICD-10-CM | POA: Diagnosis not present

## 2020-05-08 DIAGNOSIS — R29898 Other symptoms and signs involving the musculoskeletal system: Secondary | ICD-10-CM | POA: Diagnosis not present

## 2020-05-08 DIAGNOSIS — M6281 Muscle weakness (generalized): Secondary | ICD-10-CM
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
## 2020-05-12 ENCOUNTER — Other Ambulatory Visit: Payer: Self-pay

## 2020-05-12 ENCOUNTER — Encounter: Payer: Self-pay | Admitting: Physical Therapy

## 2020-05-12 ENCOUNTER — Ambulatory Visit: Payer: BC Managed Care – PPO | Attending: Neurology | Admitting: Physical Therapy

## 2020-05-12 DIAGNOSIS — R2689 Other abnormalities of gait and mobility: Secondary | ICD-10-CM | POA: Diagnosis not present

## 2020-05-12 DIAGNOSIS — M6281 Muscle weakness (generalized): Secondary | ICD-10-CM | POA: Diagnosis present
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
## 2020-05-15 ENCOUNTER — Encounter: Payer: Self-pay | Admitting: Rehabilitative and Restorative Service Providers"

## 2020-05-15 ENCOUNTER — Other Ambulatory Visit: Payer: Self-pay

## 2020-05-15 ENCOUNTER — Ambulatory Visit (INDEPENDENT_AMBULATORY_CARE_PROVIDER_SITE_OTHER): Payer: BC Managed Care – PPO | Admitting: Rehabilitative and Restorative Service Providers"

## 2020-05-15 DIAGNOSIS — M6281 Muscle weakness (generalized): Secondary | ICD-10-CM

## 2020-05-15 DIAGNOSIS — R2689 Other abnormalities of gait and mobility: Secondary | ICD-10-CM

## 2020-05-15 DIAGNOSIS — M25552 Pain in left hip: Secondary | ICD-10-CM
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
## 2020-05-16 ENCOUNTER — Other Ambulatory Visit: Payer: Self-pay

## 2020-05-16 ENCOUNTER — Telehealth: Payer: Self-pay | Admitting: Endocrinology

## 2020-05-16 NOTE — Telephone Encounter (Signed)
Pt already scheduled as indicated below:  Next Appt With Internal Medicine Renato Shin, MD) 05/20/2020 at 2:00 PM

## 2020-05-16 NOTE — Telephone Encounter (Signed)
please contact patient: F/u is due 

## 2020-05-20 ENCOUNTER — Other Ambulatory Visit: Payer: Self-pay

## 2020-05-20 ENCOUNTER — Encounter: Payer: Self-pay | Admitting: Endocrinology

## 2020-05-20 ENCOUNTER — Ambulatory Visit (INDEPENDENT_AMBULATORY_CARE_PROVIDER_SITE_OTHER): Payer: BC Managed Care – PPO | Admitting: Endocrinology

## 2020-05-20 VITALS — BP 108/70 | HR 77 | Ht 63.0 in | Wt 145.0 lb

## 2020-05-20 DIAGNOSIS — E042 Nontoxic multinodular goiter: Secondary | ICD-10-CM | POA: Diagnosis not present
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
## 2020-05-20 NOTE — Patient Instructions (Addendum)
Please continue the same medication. Let's recheck the ultrasound.  you will receive a phone call, about a day and time for an appointment.    I would be happy to see you back here in 1-2 years.

## 2020-05-23 ENCOUNTER — Ambulatory Visit (INDEPENDENT_AMBULATORY_CARE_PROVIDER_SITE_OTHER): Payer: BC Managed Care – PPO | Admitting: Rehabilitative and Restorative Service Providers"

## 2020-05-23 ENCOUNTER — Encounter: Payer: Self-pay | Admitting: Rehabilitative and Restorative Service Providers"

## 2020-05-23 DIAGNOSIS — M25552 Pain in left hip: Secondary | ICD-10-CM | POA: Diagnosis not present

## 2020-05-23 DIAGNOSIS — M6281 Muscle weakness (generalized): Secondary | ICD-10-CM | POA: Diagnosis not present

## 2020-05-23 DIAGNOSIS — R29898 Other symptoms and signs involving the musculoskeletal system: Secondary | ICD-10-CM | POA: Diagnosis not present

## 2020-05-23 DIAGNOSIS — R2689 Other abnormalities of gait and mobility: Secondary | ICD-10-CM | POA: Diagnosis not present

## 2020-05-23 NOTE — Patient Instructions (Signed)
Access Code: [email or web address] URL: [email or web address] Date: 05/23/2020 Prepared by: Rudell Cobb  Exercises Prone Hip Extension - 2 x daily - 7 x weekly - 1 sets - 10 reps Supine Hip Adductor Stretch - 2 x daily - 7 x weekly - 1 sets - 3 reps - 20 seconds hold Bridge - 2 x daily - 7 x weekly - 1 sets - 10 reps Hip Flexor Stretch at [employer] of Bed - 2 x daily - 7 x weekly - 1 sets - 3 reps - 20-30 seconds hold Seated Hip External Rotation AROM - 2 x daily - 7 x weekly - 1 sets - 10 reps Mini Squat - 2 x daily - 7 x weekly - 1 sets - 10 reps Standing Hip Flexion March - 2 x daily - 7 x weekly - 1 sets - 10 reps
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
## 2020-05-26 ENCOUNTER — Ambulatory Visit: Payer: BC Managed Care – PPO | Attending: Family Medicine | Admitting: Physical Therapy

## 2020-05-26 ENCOUNTER — Other Ambulatory Visit: Payer: Self-pay

## 2020-05-26 DIAGNOSIS — M6281 Muscle weakness (generalized): Secondary | ICD-10-CM

## 2020-05-26 DIAGNOSIS — R2689 Other abnormalities of gait and mobility: Secondary | ICD-10-CM | POA: Diagnosis not present

## 2020-05-27 ENCOUNTER — Encounter: Payer: Self-pay | Admitting: Physical Therapy
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
## 2020-05-30 ENCOUNTER — Encounter: Payer: Self-pay | Admitting: Rehabilitative and Restorative Service Providers"

## 2020-05-30 ENCOUNTER — Other Ambulatory Visit: Payer: Self-pay

## 2020-05-30 ENCOUNTER — Ambulatory Visit (INDEPENDENT_AMBULATORY_CARE_PROVIDER_SITE_OTHER): Payer: BC Managed Care – PPO | Admitting: Rehabilitative and Restorative Service Providers"

## 2020-05-30 DIAGNOSIS — R29898 Other symptoms and signs involving the musculoskeletal system: Secondary | ICD-10-CM

## 2020-05-30 DIAGNOSIS — R2689 Other abnormalities of gait and mobility: Secondary | ICD-10-CM | POA: Diagnosis not present

## 2020-05-30 DIAGNOSIS — M6281 Muscle weakness (generalized): Secondary | ICD-10-CM | POA: Diagnosis not present

## 2020-05-30 DIAGNOSIS — M25552 Pain in left hip: Secondary | ICD-10-CM
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
## 2020-06-02 ENCOUNTER — Ambulatory Visit: Payer: BC Managed Care – PPO | Admitting: Physical Therapy

## 2020-06-02 ENCOUNTER — Other Ambulatory Visit: Payer: Self-pay

## 2020-06-02 DIAGNOSIS — R2689 Other abnormalities of gait and mobility: Secondary | ICD-10-CM | POA: Diagnosis not present

## 2020-06-02 DIAGNOSIS — M6281 Muscle weakness (generalized): Secondary | ICD-10-CM

## 2020-06-03 ENCOUNTER — Ambulatory Visit
Admission: RE | Admit: 2020-06-03 | Discharge: 2020-06-03 | Disposition: A | Discharge status: Home / Self Care | Payer: BC Managed Care – PPO | Source: Ambulatory Visit | Attending: Endocrinology | Admitting: Endocrinology

## 2020-06-03 ENCOUNTER — Encounter: Payer: Self-pay | Admitting: Physical Therapy

## 2020-06-03 DIAGNOSIS — E042 Nontoxic multinodular goiter: Secondary | ICD-10-CM
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
## 2020-06-04 ENCOUNTER — Encounter: Payer: Self-pay | Admitting: Rehabilitative and Restorative Service Providers"

## 2020-06-04 ENCOUNTER — Other Ambulatory Visit: Payer: Self-pay

## 2020-06-04 ENCOUNTER — Ambulatory Visit (INDEPENDENT_AMBULATORY_CARE_PROVIDER_SITE_OTHER): Payer: BC Managed Care – PPO | Admitting: Rehabilitative and Restorative Service Providers"

## 2020-06-04 DIAGNOSIS — R29818 Other symptoms and signs involving the nervous system: Secondary | ICD-10-CM

## 2020-06-04 DIAGNOSIS — R2689 Other abnormalities of gait and mobility: Secondary | ICD-10-CM

## 2020-06-04 DIAGNOSIS — M6281 Muscle weakness (generalized): Secondary | ICD-10-CM

## 2020-06-04 DIAGNOSIS — R29898 Other symptoms and signs involving the musculoskeletal system: Secondary | ICD-10-CM | POA: Diagnosis not present

## 2020-06-04 DIAGNOSIS — M25552 Pain in left hip: Secondary | ICD-10-CM

## 2020-06-04 NOTE — Patient Instructions (Signed)
Access Code: [email or web address]: [email or web address]: 06/16/2021Prepared by: Sheyanne Munley HoltExercises  Prone Hip Extension - 2 x daily - 7 x weekly - 1 sets - 10 reps  Supine Hip Adductor Stretch - 2 x daily - 7 x weekly - 1 sets - 3 reps - 20 seconds hold  Bridge - 2 x daily - 7 x weekly - 1 sets - 10 reps  Hip Flexor Stretch at [employer] of Bed - 2 x daily - 7 x weekly - 1 sets - 3 reps - 20-30 seconds hold  Seated Hip External Rotation AROM - 2 x daily - 7 x weekly - 1 sets - 10 reps  Mini Squat - 2 x daily - 7 x weekly - 1 sets - 10 reps  Standing Hip Flexion March - 2 x daily - 7 x weekly - 1 sets - 10 reps  Wall Quarter Squat - 1 x daily - 7 x weekly - 1 sets - 10 reps - 10 sec hold  Step Up - 1 x daily - 7 x weekly - 1 sets - 10 reps
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
## 2020-06-09 ENCOUNTER — Other Ambulatory Visit: Payer: Self-pay

## 2020-06-09 ENCOUNTER — Ambulatory Visit: Payer: BC Managed Care – PPO | Admitting: Physical Therapy

## 2020-06-09 DIAGNOSIS — M6281 Muscle weakness (generalized): Secondary | ICD-10-CM

## 2020-06-09 DIAGNOSIS — R2689 Other abnormalities of gait and mobility: Secondary | ICD-10-CM
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
## 2020-06-12 ENCOUNTER — Other Ambulatory Visit: Payer: Self-pay

## 2020-06-12 ENCOUNTER — Ambulatory Visit (INDEPENDENT_AMBULATORY_CARE_PROVIDER_SITE_OTHER): Payer: BC Managed Care – PPO | Admitting: Rehabilitative and Restorative Service Providers"

## 2020-06-12 DIAGNOSIS — M25552 Pain in left hip: Secondary | ICD-10-CM | POA: Diagnosis not present

## 2020-06-12 DIAGNOSIS — M6281 Muscle weakness (generalized): Secondary | ICD-10-CM | POA: Diagnosis not present

## 2020-06-12 DIAGNOSIS — R29898 Other symptoms and signs involving the musculoskeletal system: Secondary | ICD-10-CM

## 2020-06-12 DIAGNOSIS — R2689 Other abnormalities of gait and mobility: Secondary | ICD-10-CM | POA: Diagnosis not present

## 2020-06-12 NOTE — Patient Instructions (Signed)
Access Code: [email or web address] URL: [email or web address] Date: 06/12/2020 Prepared by: Rudell Cobb  Program Notes 66 beats per minute walking with the metronome   Exercises Prone Hip Extension - 2 x daily - 7 x weekly - 1 sets - 10 reps Supine Hip Adductor Stretch - 2 x daily - 7 x weekly - 1 sets - 3 reps - 20 seconds hold Bridge - 2 x daily - 7 x weekly - 1 sets - 10 reps Hip Flexor Stretch at [employer] of Bed - 2 x daily - 7 x weekly - 1 sets - 3 reps - 20-30 seconds hold Seated Hip External Rotation AROM - 2 x daily - 7 x weekly - 1 sets - 10 reps Seated Flexion Stretch with Swiss Ball - 2 x daily - 7 x weekly - 1 sets - 10 reps Mini Squat - 2 x daily - 7 x weekly - 1 sets - 10 reps Standing Hip Flexion March - 2 x daily - 7 x weekly - 1 sets - 10 reps Wall Quarter Squat - 1 x daily - 7 x weekly - 1 sets - 10 reps - 10 sec hold Step Up - 1 x daily - 7 x weekly - 1 sets - 10 reps Standing Hip Flexor Stretch - 2 x daily - 7 x weekly - 1 sets - 3 reps - 20-30 seconds hold
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
## 2020-06-16 ENCOUNTER — Encounter: Payer: Self-pay | Admitting: Physical Therapy

## 2020-06-16 ENCOUNTER — Other Ambulatory Visit: Payer: Self-pay

## 2020-06-16 ENCOUNTER — Ambulatory Visit: Payer: BC Managed Care – PPO | Admitting: Physical Therapy

## 2020-06-16 DIAGNOSIS — R2689 Other abnormalities of gait and mobility: Secondary | ICD-10-CM | POA: Diagnosis not present

## 2020-06-16 DIAGNOSIS — M6281 Muscle weakness (generalized): Secondary | ICD-10-CM
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
## 2020-06-20 ENCOUNTER — Ambulatory Visit (INDEPENDENT_AMBULATORY_CARE_PROVIDER_SITE_OTHER): Payer: BC Managed Care – PPO | Admitting: Rehabilitative and Restorative Service Providers"

## 2020-06-20 ENCOUNTER — Other Ambulatory Visit: Payer: Self-pay

## 2020-06-20 DIAGNOSIS — R2689 Other abnormalities of gait and mobility: Secondary | ICD-10-CM | POA: Diagnosis not present

## 2020-06-20 DIAGNOSIS — M25552 Pain in left hip: Secondary | ICD-10-CM

## 2020-06-20 DIAGNOSIS — M6281 Muscle weakness (generalized): Secondary | ICD-10-CM | POA: Diagnosis not present
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
## 2020-06-25 ENCOUNTER — Ambulatory Visit: Payer: BC Managed Care – PPO | Admitting: Physical Therapy

## 2020-06-25 ENCOUNTER — Encounter: Payer: BC Managed Care – PPO | Admitting: Rehabilitative and Restorative Service Providers"

## 2020-06-30 ENCOUNTER — Other Ambulatory Visit: Payer: Self-pay

## 2020-06-30 ENCOUNTER — Ambulatory Visit (INDEPENDENT_AMBULATORY_CARE_PROVIDER_SITE_OTHER): Payer: BC Managed Care – PPO | Admitting: Rehabilitative and Restorative Service Providers"

## 2020-06-30 ENCOUNTER — Encounter: Payer: Self-pay | Admitting: Rehabilitative and Restorative Service Providers"

## 2020-06-30 DIAGNOSIS — M25552 Pain in left hip: Secondary | ICD-10-CM

## 2020-06-30 DIAGNOSIS — R29898 Other symptoms and signs involving the musculoskeletal system: Secondary | ICD-10-CM

## 2020-06-30 DIAGNOSIS — M6281 Muscle weakness (generalized): Secondary | ICD-10-CM

## 2020-06-30 DIAGNOSIS — R2689 Other abnormalities of gait and mobility: Secondary | ICD-10-CM
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
## 2020-06-30 NOTE — Patient Instructions (Signed)
Access Code: [email or web address] URL: [email or web address] Date: 06/30/2020 Prepared by: Rudell Cobb  Program Notes 58 beats per minute walking with the metronome *NO device   Exercises Quadruped Rockback Posterior Hip Capsule Stretch - 2 x daily - 7 x weekly - 1 sets - 10 reps Prone Hip Extension - Two Pillows - 2 x daily - 7 x weekly - 1 sets - 10 reps Supine Hip Adductor Stretch - 2 x daily - 7 x weekly - 1 sets - 3 reps - 20 seconds hold Hip Flexor Stretch at Edge of Bed - 2 x daily - 7 x weekly - 1 sets - 3 reps - 20-30 seconds hold Seated Hip External Rotation Stretch - 2 x daily - 7 x weekly - 1 sets - 10 reps Standing Hip Flexion March - 2 x daily - 7 x weekly - 1 sets - 10 reps Standing Hip Flexor Stretch - 2 x daily - 7 x weekly - 1 sets - 3 reps - 20-30 seconds hold Squat - 2 x daily - 7 x weekly - 1 sets - 10 reps Single Leg Heel Raise with Chair Support - 2 x daily - 7 x weekly - 1 sets - 10 reps

## 2020-07-10 ENCOUNTER — Telehealth: Payer: Self-pay | Admitting: Neurology

## 2020-07-10 NOTE — Telephone Encounter (Signed)
I am fine with seeing her if patient is agreeable

## 2020-07-10 NOTE — Telephone Encounter (Signed)
This is a 49 year old patient with cryptogenic stroke, I think her care is best with Dr. Leonie Man and I would not have anymore to add than him thanks

## 2020-07-10 NOTE — Telephone Encounter (Signed)
Patient has been referred back to [place] for Balance Problems and Embolism of posterior cerebral artery. She has seen Dr. Leonie Man in the past (2018), but is requesting to switch her care over to Dr. Jaynee Eagles. Would you both be ok with this?

## 2020-07-11 ENCOUNTER — Ambulatory Visit (INDEPENDENT_AMBULATORY_CARE_PROVIDER_SITE_OTHER): Payer: BC Managed Care – PPO | Admitting: Rehabilitative and Restorative Service Providers"

## 2020-07-11 ENCOUNTER — Other Ambulatory Visit: Payer: Self-pay

## 2020-07-11 DIAGNOSIS — R29898 Other symptoms and signs involving the musculoskeletal system: Secondary | ICD-10-CM | POA: Diagnosis not present

## 2020-07-11 DIAGNOSIS — M25552 Pain in left hip: Secondary | ICD-10-CM

## 2020-07-11 DIAGNOSIS — M6281 Muscle weakness (generalized): Secondary | ICD-10-CM | POA: Diagnosis not present

## 2020-07-11 DIAGNOSIS — R2689 Other abnormalities of gait and mobility: Secondary | ICD-10-CM
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
## 2020-07-14 ENCOUNTER — Encounter: Payer: BC Managed Care – PPO | Admitting: Rehabilitative and Restorative Service Providers"

## 2020-07-14 NOTE — Telephone Encounter (Signed)
Could be . Ok to reduce aspirin to enteric coated 81 mg daily

## 2020-07-14 NOTE — Telephone Encounter (Signed)
Patient is agreeable to that and has been added to your schedule. She also has a question in regards to her aspirin dosage. She's been taking 325mg  daily since the stroke, but has been having stomach cramps lately and is wondering if it's related to that. She'd like to know if it would be ok to reduce dosage?

## 2020-07-21 ENCOUNTER — Encounter: Payer: BC Managed Care – PPO | Admitting: Rehabilitative and Restorative Service Providers"

## 2020-07-23 ENCOUNTER — Encounter: Payer: Self-pay | Admitting: Rehabilitative and Restorative Service Providers"

## 2020-08-01 ENCOUNTER — Ambulatory Visit (INDEPENDENT_AMBULATORY_CARE_PROVIDER_SITE_OTHER): Payer: BC Managed Care – PPO | Admitting: Rehabilitative and Restorative Service Providers"

## 2020-08-01 ENCOUNTER — Other Ambulatory Visit: Payer: Self-pay

## 2020-08-01 ENCOUNTER — Encounter: Payer: Self-pay | Admitting: Rehabilitative and Restorative Service Providers"

## 2020-08-01 DIAGNOSIS — R2689 Other abnormalities of gait and mobility: Secondary | ICD-10-CM

## 2020-08-01 DIAGNOSIS — M6281 Muscle weakness (generalized): Secondary | ICD-10-CM | POA: Diagnosis not present

## 2020-08-01 DIAGNOSIS — R29898 Other symptoms and signs involving the musculoskeletal system: Secondary | ICD-10-CM | POA: Diagnosis not present

## 2020-08-01 DIAGNOSIS — M25552 Pain in left hip: Secondary | ICD-10-CM
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
## 2020-08-01 NOTE — Patient Instructions (Signed)
Access Code: [email or web address] URL: [email or web address] Date: 08/01/2020 Prepared by: Rudell Cobb  Program Notes Return to a jog walk program. Leg press at the gym -- begin with low weight and more space at the seat. Leg extension/long arc quad machine-- begin with both legs and then reduce weight and do left leg only   Exercises Quadruped Rockback Posterior Hip Capsule Stretch - 2 x daily - 7 x weekly - 1 sets - 10 reps Kneeling Adductor Stretch with Hip External Rotation - 2 x daily - 7 x weekly - 1 sets - 2 reps - 30 seconds hold Single Leg Bridge with Leg Supported - 2 x daily - 7 x weekly - 1 sets - 10 reps Supine Hip External Rotation Stretch - 2 x daily - 7 x weekly - 1 sets - 10 reps Prone Hip Extension - Two Pillows - 2 x daily - 7 x weekly - 1 sets - 10 reps Squat - 2 x daily - 7 x weekly - 1 sets - 10 reps Jumping Jacks - 2 x daily - 7 x weekly - 1 sets - 10 reps

## 2020-08-11 ENCOUNTER — Ambulatory Visit: Payer: Self-pay | Admitting: Physical Therapy

## 2020-08-18 ENCOUNTER — Ambulatory Visit: Payer: BC Managed Care – PPO | Attending: Family Medicine | Admitting: Physical Therapy

## 2020-08-18 ENCOUNTER — Encounter: Payer: Self-pay | Admitting: Physical Therapy

## 2020-08-18 ENCOUNTER — Other Ambulatory Visit: Payer: Self-pay

## 2020-08-18 DIAGNOSIS — R2689 Other abnormalities of gait and mobility: Secondary | ICD-10-CM

## 2020-08-18 DIAGNOSIS — M6281 Muscle weakness (generalized): Secondary | ICD-10-CM | POA: Diagnosis present

## 2020-08-18 DIAGNOSIS — M25552 Pain in left hip: Secondary | ICD-10-CM | POA: Insufficient documentation
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
## 2020-08-22 ENCOUNTER — Other Ambulatory Visit: Payer: Self-pay

## 2020-08-22 ENCOUNTER — Ambulatory Visit (INDEPENDENT_AMBULATORY_CARE_PROVIDER_SITE_OTHER): Payer: BC Managed Care – PPO | Admitting: Rehabilitative and Restorative Service Providers"

## 2020-08-22 DIAGNOSIS — R29898 Other symptoms and signs involving the musculoskeletal system: Secondary | ICD-10-CM | POA: Diagnosis not present

## 2020-08-22 DIAGNOSIS — M6281 Muscle weakness (generalized): Secondary | ICD-10-CM | POA: Diagnosis not present

## 2020-08-22 DIAGNOSIS — R2689 Other abnormalities of gait and mobility: Secondary | ICD-10-CM

## 2020-08-22 DIAGNOSIS — M25552 Pain in left hip: Secondary | ICD-10-CM
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
## 2020-08-22 NOTE — Patient Instructions (Signed)
Access Code: [email or web address] URL: [email or web address] Date: 08/22/2020 Prepared by: Rudell Cobb  Program Notes Return to a jog walk program. Leg press at the gym -- begin with low weight and more space at the seat. Leg extension/long arc quad machine-- begin with both legs and then reduce weight and do left leg only   Exercises Quadruped Rockback Posterior Hip Capsule Stretch - 2 x daily - 7 x weekly - 1 sets - 3 reps - 30 seconds hold Supine Hip External Rotation Stretch - 2 x daily - 7 x weekly - 1 sets - 3 reps - 30 seconds hold Prone Hip Extension - Two Pillows - 2 x daily - 7 x weekly - 1 sets - 10 reps Squat - 2 x daily - 7 x weekly - 1 sets - 10 reps Jumping Jacks - 2 x daily - 7 x weekly - 1 sets - 10 reps Side Lunge Adductor Stretch - 2 x daily - 7 x weekly - 1 sets - 3 reps - 30 sec hold Single Leg Balance with Clock Reach - 2 x daily - 7 x weekly - 1 sets - 3 reps

## 2020-08-27 ENCOUNTER — Ambulatory Visit: Payer: BC Managed Care – PPO | Attending: Family Medicine | Admitting: Physical Therapy

## 2020-08-27 ENCOUNTER — Other Ambulatory Visit: Payer: Self-pay

## 2020-08-27 DIAGNOSIS — M6281 Muscle weakness (generalized): Secondary | ICD-10-CM | POA: Diagnosis present

## 2020-08-27 DIAGNOSIS — R2689 Other abnormalities of gait and mobility: Secondary | ICD-10-CM | POA: Diagnosis present
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
## 2020-09-08 ENCOUNTER — Encounter: Payer: Self-pay | Admitting: Rehabilitative and Restorative Service Providers"

## 2020-09-08 ENCOUNTER — Ambulatory Visit (INDEPENDENT_AMBULATORY_CARE_PROVIDER_SITE_OTHER): Payer: BC Managed Care – PPO | Admitting: Rehabilitative and Restorative Service Providers"

## 2020-09-08 ENCOUNTER — Other Ambulatory Visit: Payer: Self-pay

## 2020-09-08 DIAGNOSIS — M6281 Muscle weakness (generalized): Secondary | ICD-10-CM

## 2020-09-08 DIAGNOSIS — M25552 Pain in left hip: Secondary | ICD-10-CM

## 2020-09-08 DIAGNOSIS — R29898 Other symptoms and signs involving the musculoskeletal system: Secondary | ICD-10-CM

## 2020-09-08 DIAGNOSIS — R2689 Other abnormalities of gait and mobility: Secondary | ICD-10-CM | POA: Diagnosis not present

## 2020-09-08 NOTE — Patient Instructions (Signed)
Access Code: [email or web address] URL: [email or web address] Date: 09/08/2020 Prepared by: Rudell Cobb  Program Notes Return to a jog walk program. Leg press at the gym -- begin with low weight and more space at the seat. Leg extension/long arc quad machine-- begin with both legs and then reduce weight and do left leg only   Exercises Quadruped Rockback Posterior Hip Capsule Stretch - 2 x daily - 7 x weekly - 1 sets - 3 reps - 30 seconds hold Supine Hip External Rotation Stretch - 2 x daily - 7 x weekly - 1 sets - 3 reps - 30 seconds hold Prone Hip Extension - Two Pillows - 2 x daily - 7 x weekly - 1 sets - 10 reps Squat - 2 x daily - 7 x weekly - 1 sets - 10 reps Jumping Jacks - 2 x daily - 7 x weekly - 1 sets - 10 reps Side Lunge Adductor Stretch - 2 x daily - 7 x weekly - 1 sets - 3 reps - 30 sec hold Single Leg Balance with Clock Reach - 2 x daily - 7 x weekly - 1 sets - 3 reps Thomas Stretch on Table - 2 x daily - 7 x weekly - 1 sets - 2-3 reps - 1-50minutes hold Sidelying Reverse Clamshell - 2 x daily - 7 x weekly - 1 sets - 10 reps Prone Hip Internal and External Rotation AROM - 2 x daily - 7 x weekly - 1 sets - 10 reps
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
## 2020-09-11 ENCOUNTER — Institutional Professional Consult (permissible substitution): Payer: BC Managed Care – PPO | Admitting: Neurology

## 2020-10-18 ENCOUNTER — Ambulatory Visit: Payer: BC Managed Care – PPO

## 2020-11-01 ENCOUNTER — Ambulatory Visit: Payer: BC Managed Care – PPO | Attending: Internal Medicine

## 2020-11-01 DIAGNOSIS — Z23 Encounter for immunization: Secondary | ICD-10-CM

## 2020-11-01 NOTE — Progress Notes (Signed)
   Covid-19 Vaccination Clinic  Name:  Julie Blair    MRN: [id number] DOB: [birth year]  11/01/2020  [name] was observed post Covid-19 immunization for 15 minutes without incident. She was provided with Vaccine Information Sheet and instruction to access the V-Safe system.   [name] was instructed to call 911 with any severe reactions post vaccine: Marland Kitchen Difficulty breathing  . Swelling of face and throat  . A fast heartbeat  . A bad rash all over body  . Dizziness and weakness   Immunizations Administered    Name Date Dose VIS Date Route   Pfizer COVID-19 Vaccine 11/01/2020 11:56 AM 0.3 mL 10/08/2020 Intramuscular   Manufacturer: [place]   Lot: [id number]   [place]: [id number]

## 2020-12-20 IMAGING — US US THYROID
1 series · 13 of 25 positions shown · non-contrast
Comparison: 10/28/2017

CLINICAL DATA: Thyroid nodules

EXAM:
THYROID ULTRASOUND
TECHNIQUE: Ultrasound examination of the thyroid gland and adjacent soft
tissues was performed.

[Series 1: us thyroid · 0.05mm/px · 13 of 36 slices shown]
[im 1/36]
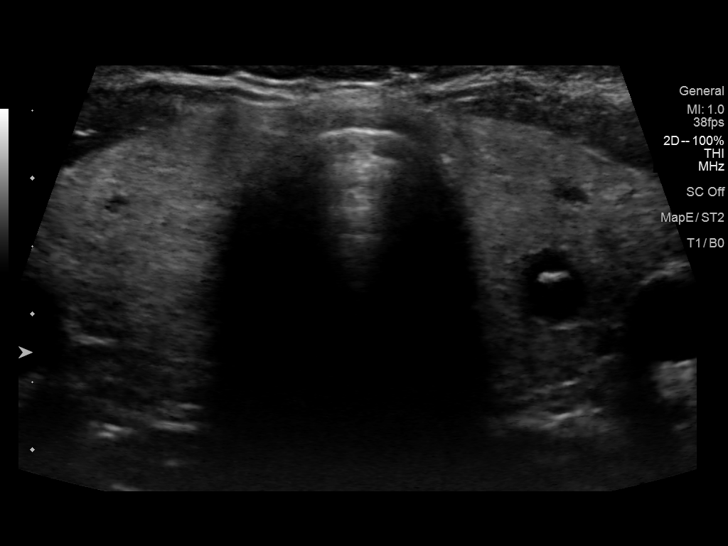
[im 3/36]
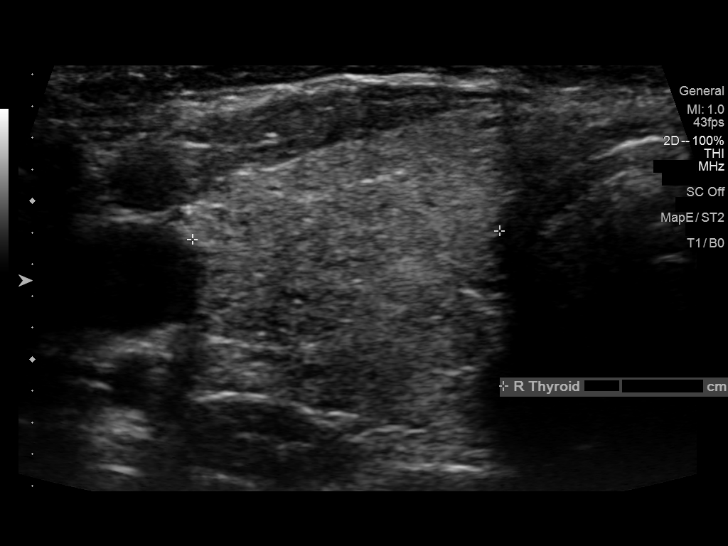
[im 6/36]
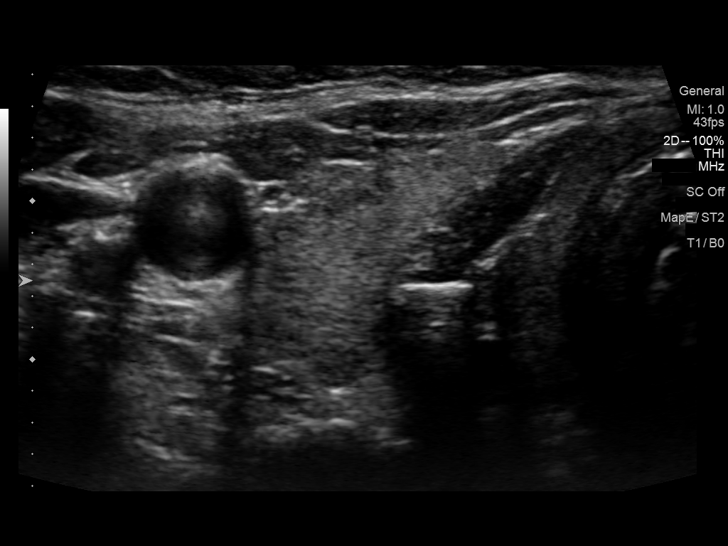
[im 9/36]
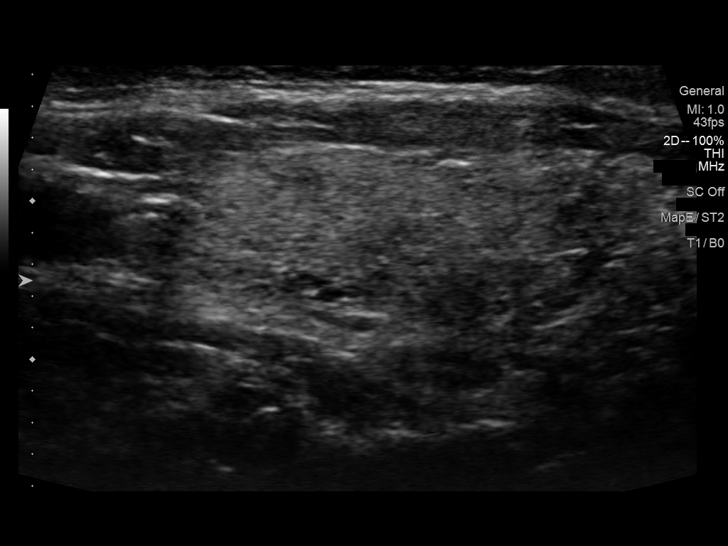
[im 12/36]
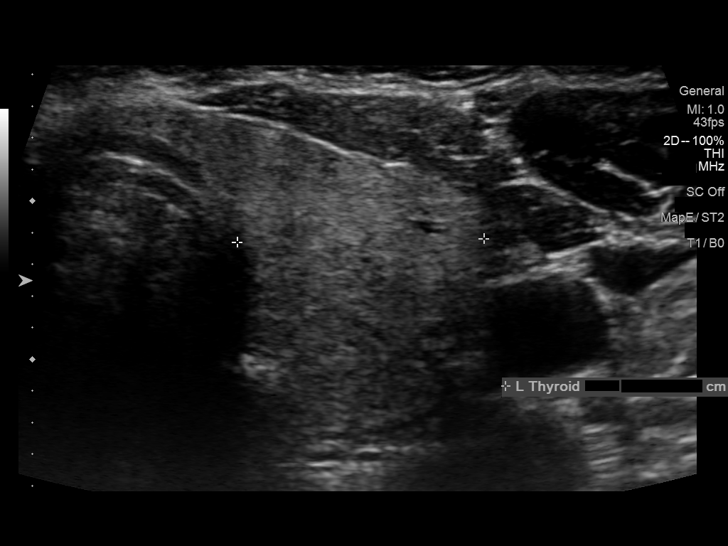
[im 15/36]
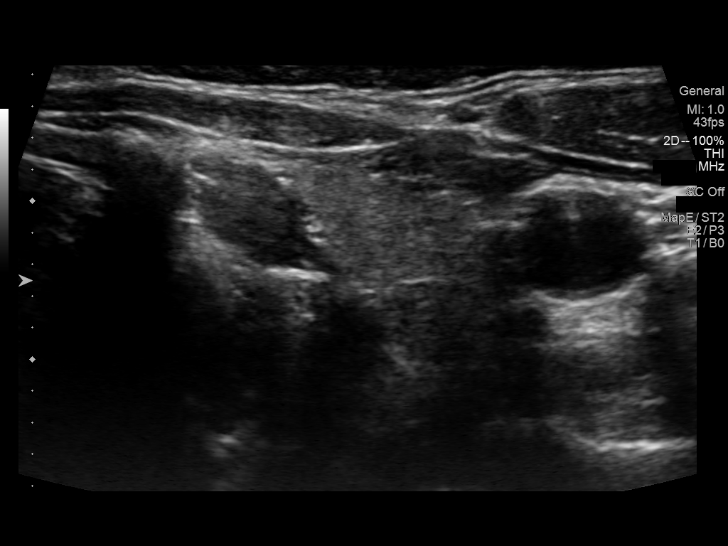
[im 18/36]
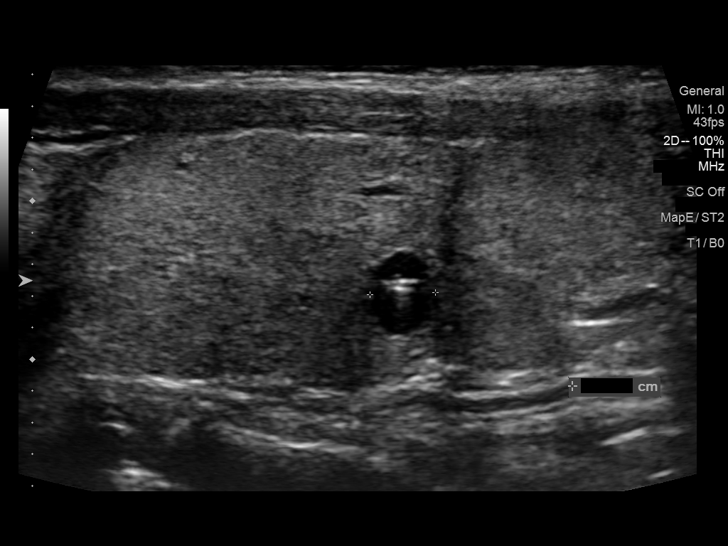
[im 21/36]
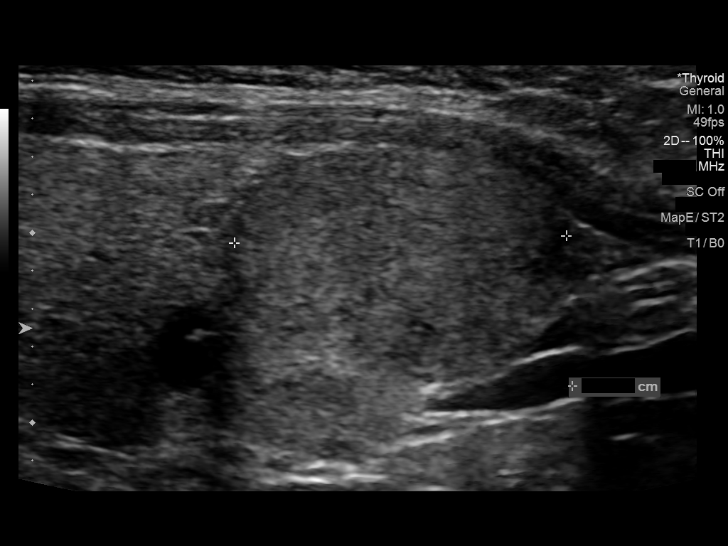
[im 24/36]
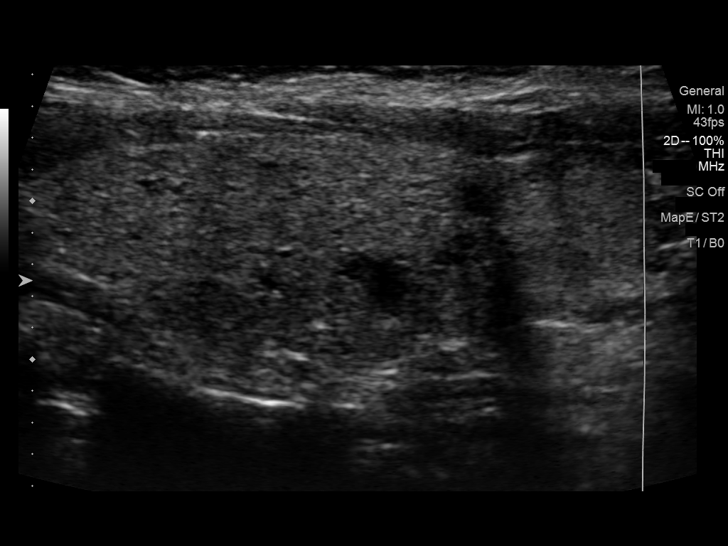
[im 27/36]
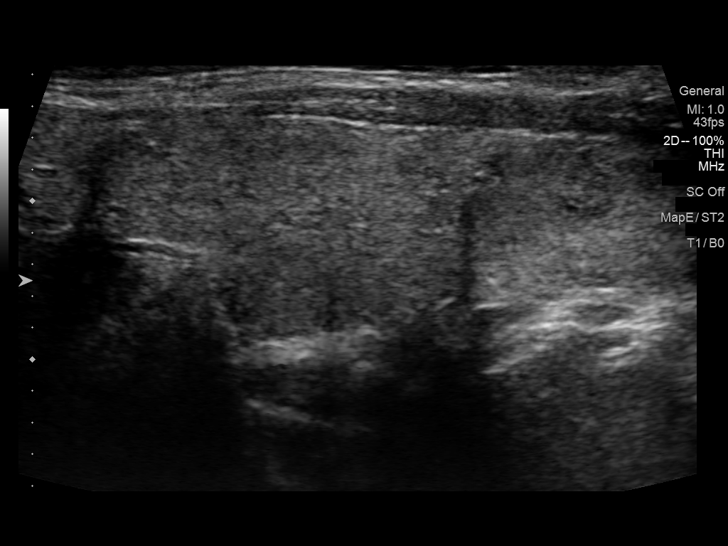
[im 30/36]
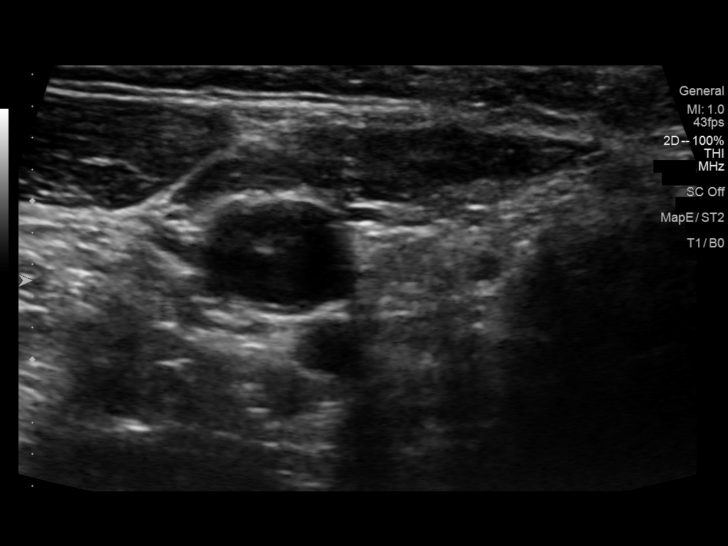
[im 33/36]
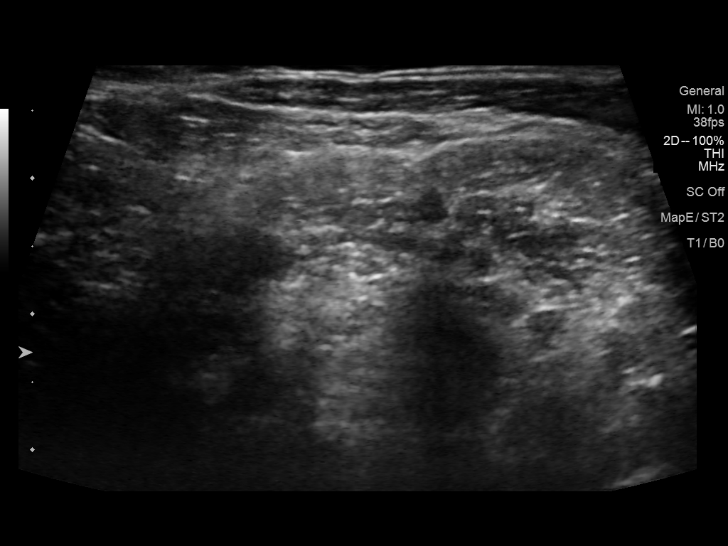
[im 36/36]
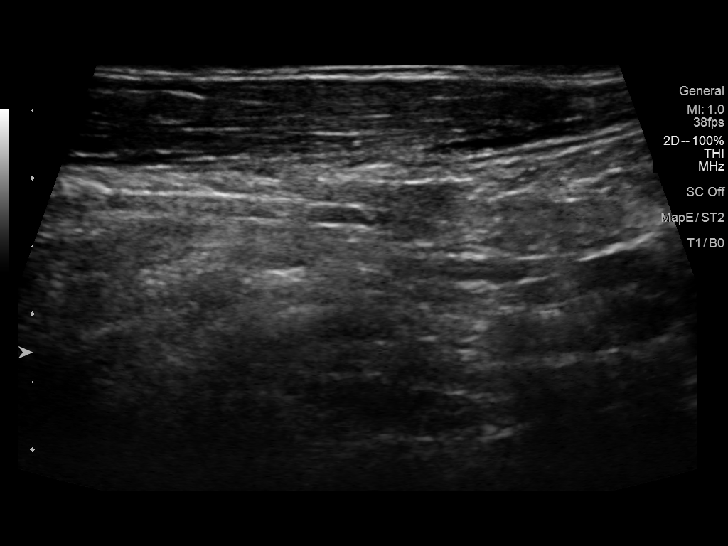

[13 of 25 positions shown; findings below may reference images not displayed]

FINDINGS: Parenchymal Echotexture: Moderately heterogenous

Isthmus: 4 mm

Right lobe: 4.0 x 1.6 x 1.9 cm

Left lobe: 4.8 x 1.6 x 1.6 cm

_________________________________________________________

Estimated total number of nodules >/= 1 cm: 1

Number of spongiform nodules >/=  2 cm not described below (TR1): 0

Number of mixed cystic and solid nodules >/= 1.5 cm not described
below (TR2): 0

_________________________________________________________

Nodule # 1:

Location: Left; Inferior

Maximum size: 1.8, previously 1.2 cm; Other 2 dimensions: 1.4 x
cm

Composition: solid/almost completely solid (2)

Echogenicity: isoechoic (1)

Shape: not taller-than-wide (0)

Margins: smooth (0)

Echogenic foci: none (0)

ACR TI-RADS total points: 3.

ACR TI-RADS risk category: TR3 (3 points).

ACR TI-RADS recommendations:

*Given size (>/= 1.5 - 2.4 cm) and appearance, a follow-up
ultrasound in 1 year should be considered based on TI-RADS criteria.

_________________________________________________________

There is an additional benign subcentimeter cystic nodule in the
left thyroid inferiorly measuring only 6 mm.

Stable thyroid heterogeneity without hypervascularity. No
significant right thyroid abnormality. No regional adenopathy.
IMPRESSION: 1.8 cm left inferior TR 3 nodule meets criteria for follow-up in 1
year. Slight enlargement compared to 10/28/2017

The above is in keeping with the ACR TI-RADS recommendations - [HOSPITAL] 2184;[DATE].

## 2020-12-25 ENCOUNTER — Other Ambulatory Visit: Payer: Self-pay

## 2020-12-25 DIAGNOSIS — Z20822 Contact with and (suspected) exposure to covid-19: Secondary | ICD-10-CM

## 2020-12-27 LAB — SARS-COV-2, NAA 2 DAY TAT

## 2020-12-27 LAB — NOVEL CORONAVIRUS, NAA: SARS-CoV-2, NAA: NOT DETECTED

## 2022-02-08 ENCOUNTER — Telehealth: Payer: Self-pay

## 2022-02-24 ENCOUNTER — Telehealth: Payer: Self-pay

## 2023-11-04 ENCOUNTER — Other Ambulatory Visit: Payer: Self-pay

## 2023-11-04 DIAGNOSIS — E042 Nontoxic multinodular goiter: Secondary | ICD-10-CM

## 2023-11-14 ENCOUNTER — Other Ambulatory Visit: Payer: BC Managed Care – PPO

## 2023-11-14 ENCOUNTER — Other Ambulatory Visit: Payer: Self-pay

## 2023-11-14 DIAGNOSIS — E042 Nontoxic multinodular goiter: Secondary | ICD-10-CM

## 2023-11-15 LAB — T4, FREE: Free T4: 1.2 ng/dL (ref 0.8–1.8)

## 2023-11-15 LAB — TSH: TSH: 2.19 m[IU]/L

## 2023-11-21 ENCOUNTER — Ambulatory Visit: Payer: Self-pay | Admitting: "Endocrinology

## 2023-11-29 ENCOUNTER — Ambulatory Visit: Payer: Self-pay | Admitting: "Endocrinology

## 2023-11-29 ENCOUNTER — Encounter: Payer: Self-pay | Admitting: "Endocrinology

## 2023-11-29 VITALS — BP 104/64 | HR 76 | Ht 63.0 in | Wt 151.6 lb

## 2023-11-29 DIAGNOSIS — E042 Nontoxic multinodular goiter: Secondary | ICD-10-CM | POA: Diagnosis not present

## 2023-11-29 DIAGNOSIS — E039 Hypothyroidism, unspecified: Secondary | ICD-10-CM
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
## 2023-12-08 ENCOUNTER — Ambulatory Visit (HOSPITAL_COMMUNITY): Payer: BC Managed Care – PPO

## 2023-12-13 ENCOUNTER — Ambulatory Visit (HOSPITAL_COMMUNITY): Payer: BC Managed Care – PPO

## 2024-05-28 ENCOUNTER — Ambulatory Visit: Payer: BC Managed Care – PPO | Admitting: "Endocrinology
